# Patient Record
Sex: Female | Born: 1980 | Race: Asian | Hispanic: No | Marital: Married | Smoking: Former smoker
Health system: Southern US, Community
[De-identification: ages and names within clinical notes are randomized; demographics above are authoritative.]

## PROBLEM LIST (undated history)

## (undated) ENCOUNTER — Inpatient Hospital Stay (HOSPITAL_COMMUNITY): Payer: Self-pay

## (undated) DIAGNOSIS — D1803 Hemangioma of intra-abdominal structures: Secondary | ICD-10-CM

## (undated) DIAGNOSIS — Z8619 Personal history of other infectious and parasitic diseases: Secondary | ICD-10-CM

## (undated) DIAGNOSIS — O42919 Preterm premature rupture of membranes, unspecified as to length of time between rupture and onset of labor, unspecified trimester: Secondary | ICD-10-CM

## (undated) DIAGNOSIS — R7303 Prediabetes: Secondary | ICD-10-CM

## (undated) DIAGNOSIS — O24419 Gestational diabetes mellitus in pregnancy, unspecified control: Secondary | ICD-10-CM

## (undated) HISTORY — DX: Hemangioma of intra-abdominal structures: D18.03

## (undated) HISTORY — PX: WISDOM TOOTH EXTRACTION: SHX21

## (undated) HISTORY — DX: Personal history of other infectious and parasitic diseases: Z86.19

## (undated) HISTORY — DX: Gestational diabetes mellitus in pregnancy, unspecified control: O24.419

---
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
## 2001-02-12 ENCOUNTER — Other Ambulatory Visit: Admission: RE | Admit: 2001-02-12 | Discharge: 2001-02-12 | Payer: Self-pay | Admitting: Obstetrics and Gynecology

## 2002-02-16 ENCOUNTER — Other Ambulatory Visit: Admission: RE | Admit: 2002-02-16 | Discharge: 2002-02-16 | Payer: Self-pay | Admitting: Obstetrics and Gynecology

## 2003-03-24 ENCOUNTER — Other Ambulatory Visit: Admission: RE | Admit: 2003-03-24 | Discharge: 2003-03-24 | Payer: Self-pay | Admitting: Obstetrics and Gynecology

## 2004-03-29 ENCOUNTER — Other Ambulatory Visit: Admission: RE | Admit: 2004-03-29 | Discharge: 2004-03-29 | Payer: Self-pay | Admitting: Obstetrics and Gynecology

## 2007-11-19 ENCOUNTER — Other Ambulatory Visit: Admission: RE | Admit: 2007-11-19 | Discharge: 2007-11-19 | Payer: Self-pay | Admitting: Obstetrics & Gynecology

## 2008-02-21 ENCOUNTER — Inpatient Hospital Stay (HOSPITAL_COMMUNITY): Admission: AD | Admit: 2008-02-21 | Discharge: 2008-02-22 | Payer: Self-pay | Admitting: Obstetrics and Gynecology

## 2008-02-22 ENCOUNTER — Encounter (INDEPENDENT_AMBULATORY_CARE_PROVIDER_SITE_OTHER): Payer: Self-pay | Admitting: *Deleted

## 2008-02-22 ENCOUNTER — Ambulatory Visit (HOSPITAL_COMMUNITY): Admission: RE | Admit: 2008-02-22 | Discharge: 2008-02-22 | Payer: Self-pay | Admitting: Obstetrics and Gynecology

## 2008-02-22 ENCOUNTER — Telehealth: Payer: Self-pay | Admitting: Internal Medicine

## 2008-02-28 ENCOUNTER — Ambulatory Visit: Payer: Self-pay | Admitting: Internal Medicine

## 2008-02-28 DIAGNOSIS — D376 Neoplasm of uncertain behavior of liver, gallbladder and bile ducts: Secondary | ICD-10-CM

## 2008-05-22 ENCOUNTER — Ambulatory Visit (HOSPITAL_COMMUNITY): Admission: RE | Admit: 2008-05-22 | Discharge: 2008-05-22 | Payer: Self-pay | Admitting: Internal Medicine

## 2008-05-30 ENCOUNTER — Encounter: Admission: RE | Admit: 2008-05-30 | Discharge: 2008-05-30 | Payer: Self-pay | Admitting: Obstetrics and Gynecology

## 2008-07-03 ENCOUNTER — Observation Stay (HOSPITAL_COMMUNITY): Admission: AD | Admit: 2008-07-03 | Discharge: 2008-07-04 | Payer: Self-pay | Admitting: Obstetrics and Gynecology

## 2008-08-06 ENCOUNTER — Inpatient Hospital Stay (HOSPITAL_COMMUNITY): Admission: AD | Admit: 2008-08-06 | Discharge: 2008-08-06 | Payer: Self-pay | Admitting: Obstetrics and Gynecology

## 2008-08-08 ENCOUNTER — Inpatient Hospital Stay (HOSPITAL_COMMUNITY): Admission: AD | Admit: 2008-08-08 | Discharge: 2008-08-10 | Payer: Self-pay | Admitting: Obstetrics & Gynecology

## 2008-08-29 ENCOUNTER — Encounter (INDEPENDENT_AMBULATORY_CARE_PROVIDER_SITE_OTHER): Payer: Self-pay

## 2008-08-30 ENCOUNTER — Telehealth: Payer: Self-pay | Admitting: Internal Medicine

## 2008-09-01 ENCOUNTER — Encounter: Admission: RE | Admit: 2008-09-01 | Discharge: 2008-09-01 | Payer: Self-pay | Admitting: Internal Medicine

## 2009-03-06 ENCOUNTER — Telehealth (INDEPENDENT_AMBULATORY_CARE_PROVIDER_SITE_OTHER): Payer: Self-pay

## 2009-03-20 ENCOUNTER — Ambulatory Visit (HOSPITAL_COMMUNITY): Admission: RE | Admit: 2009-03-20 | Discharge: 2009-03-20 | Payer: Self-pay | Admitting: Internal Medicine

## 2009-12-06 ENCOUNTER — Encounter (INDEPENDENT_AMBULATORY_CARE_PROVIDER_SITE_OTHER): Payer: Self-pay | Admitting: *Deleted

## 2009-12-07 IMAGING — US US ABDOMEN COMPLETE
1 series · 13 of 25 positions shown · non-contrast
Comparison: None

CLINICAL DATA: Right upper quadrant pain with nausea and vomiting.
15 weeks estimated gestational age

ABDOMEN ULTRASOUND
TECHNIQUE: Complete abdominal ultrasound examination was performed
including evaluation of the liver, gallbladder, bile ducts,
pancreas, kidneys, spleen, IVC, and abdominal aorta.

[Series 1: us abdomen complete · 13 of 84 slices shown]
[im 1/84]
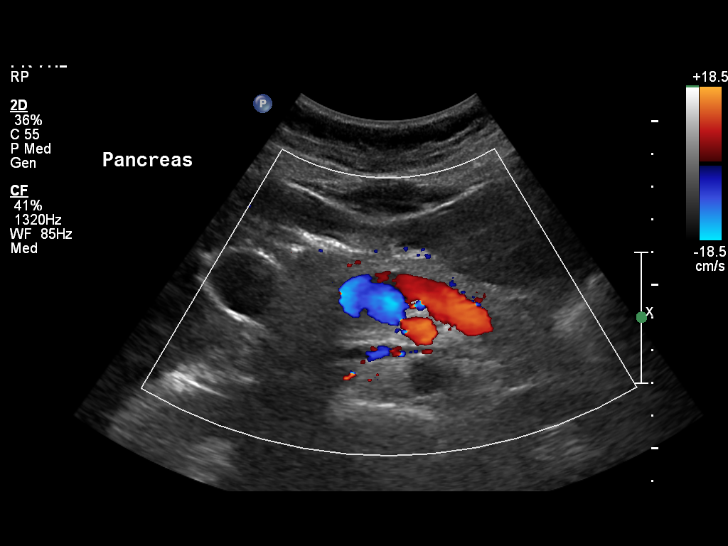
[im 7/84]
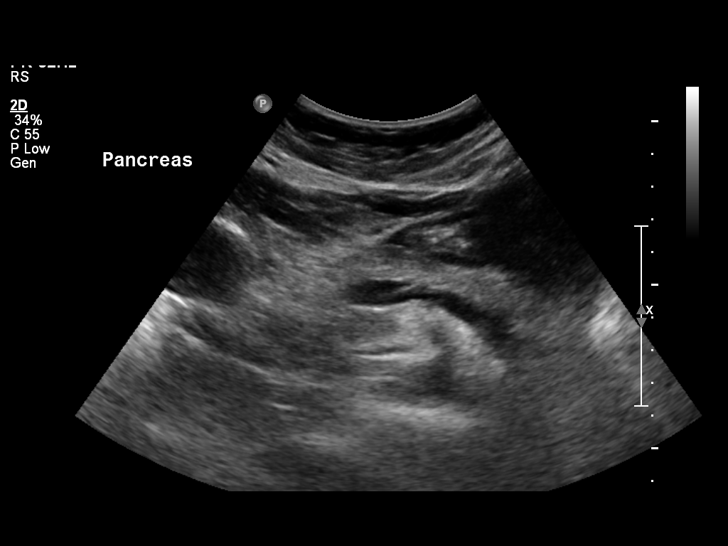
[im 14/84]
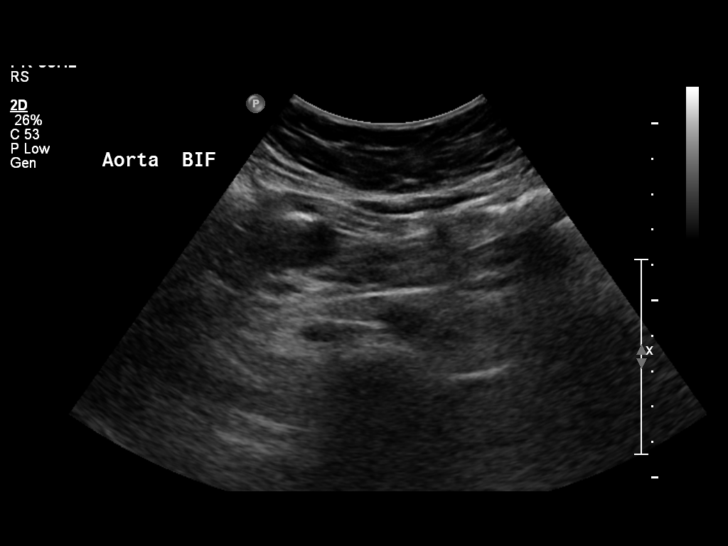
[im 21/84]
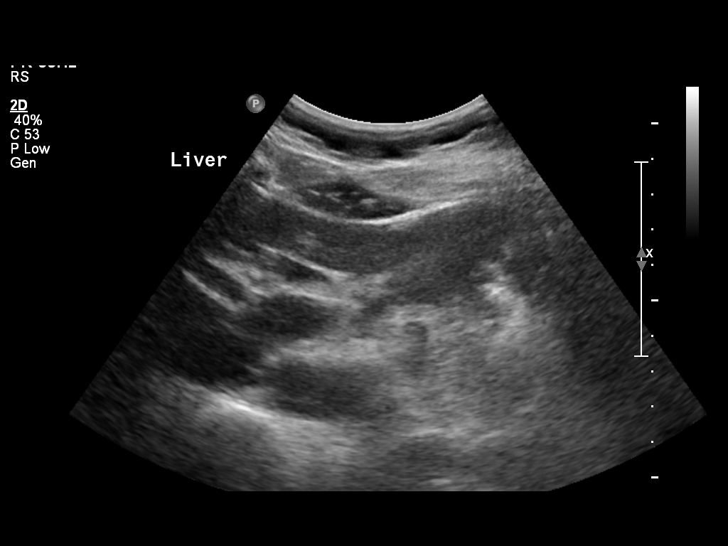
[im 28/84]
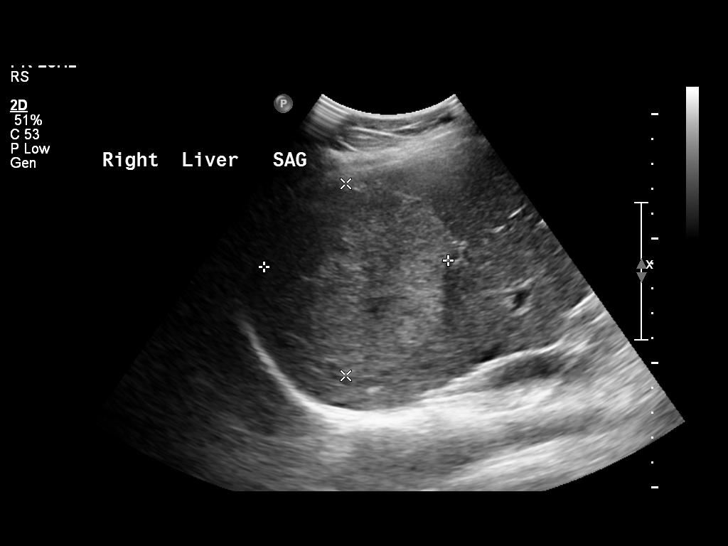
[im 35/84]
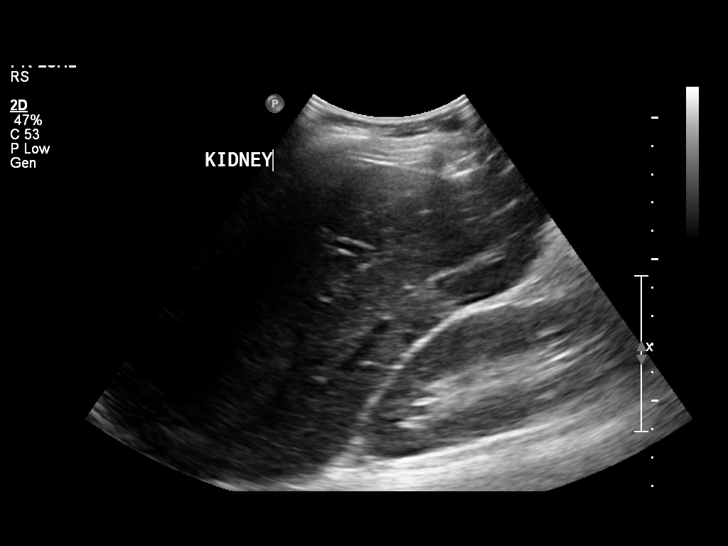
[im 42/84]
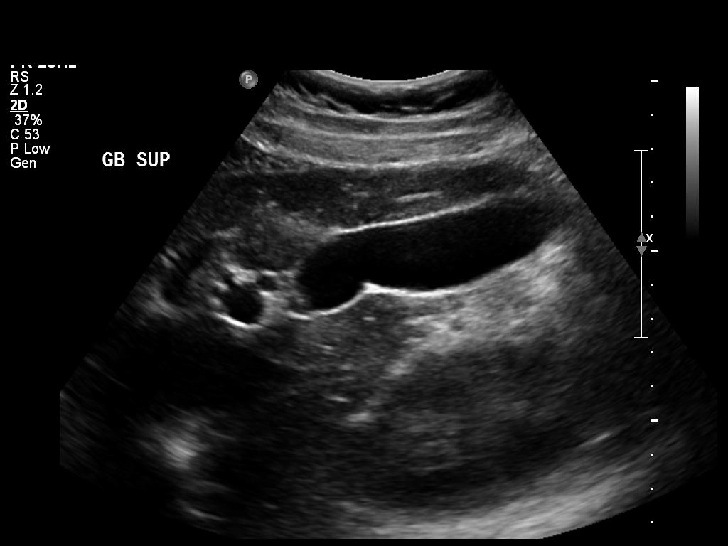
[im 49/84]
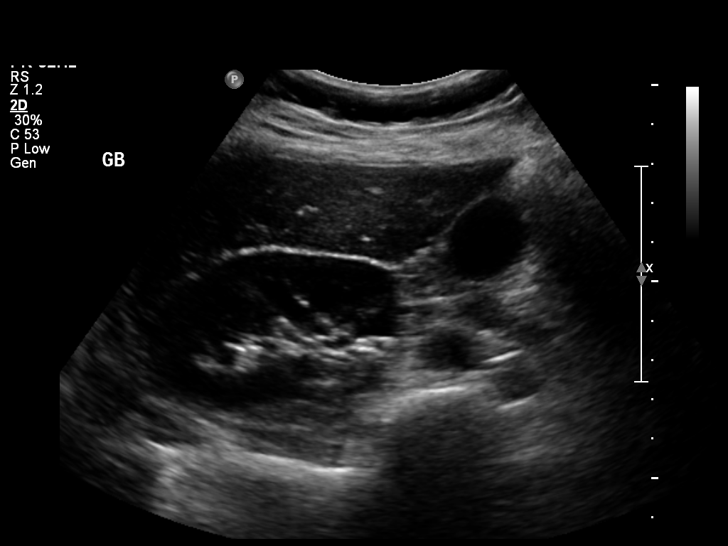
[im 56/84]
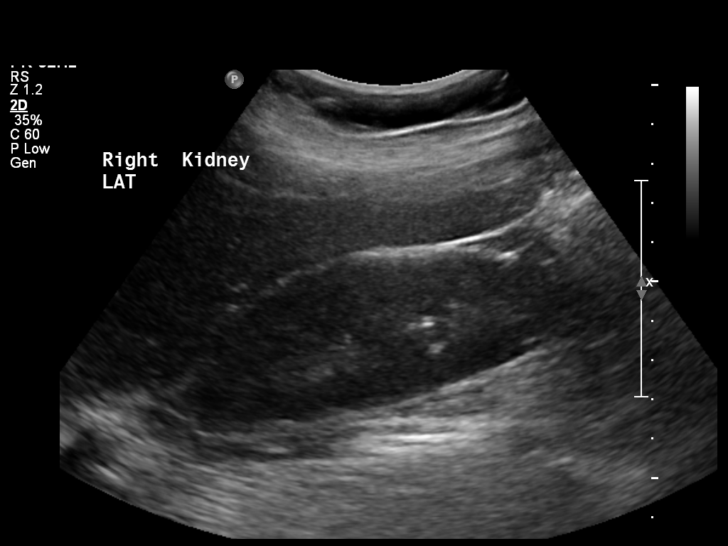
[im 63/84]
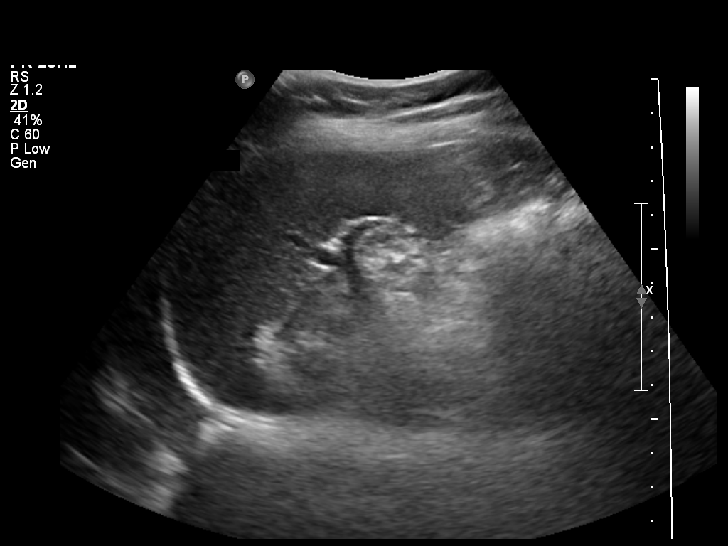
[im 70/84]
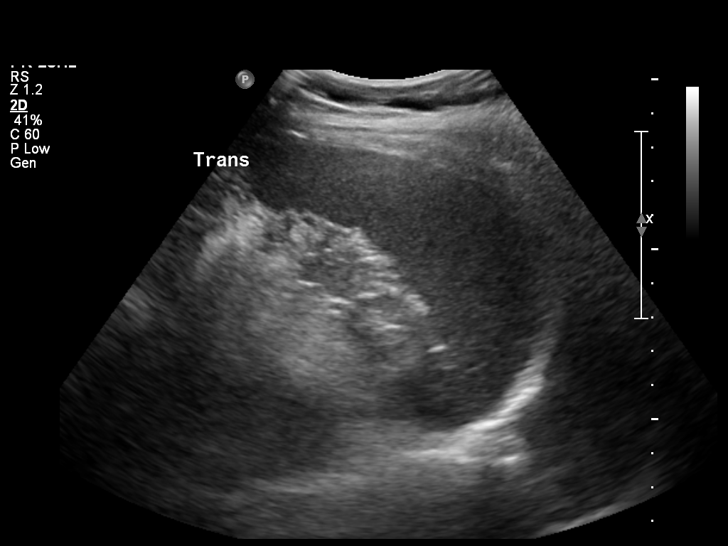
[im 77/84]
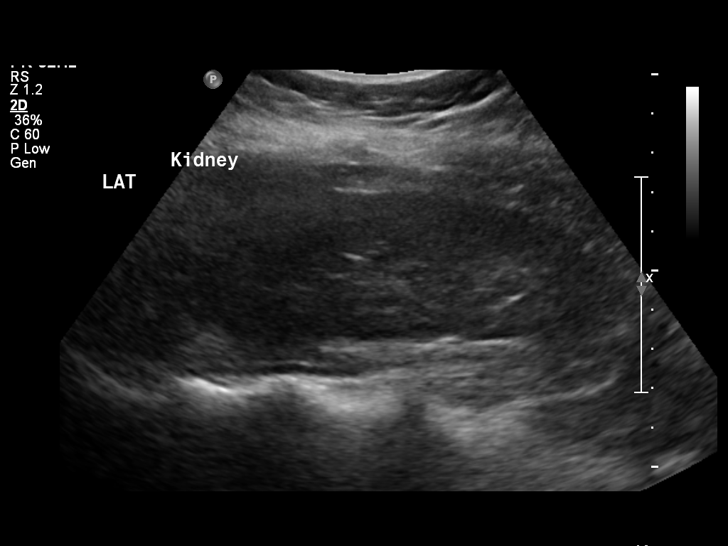
[im 84/84]
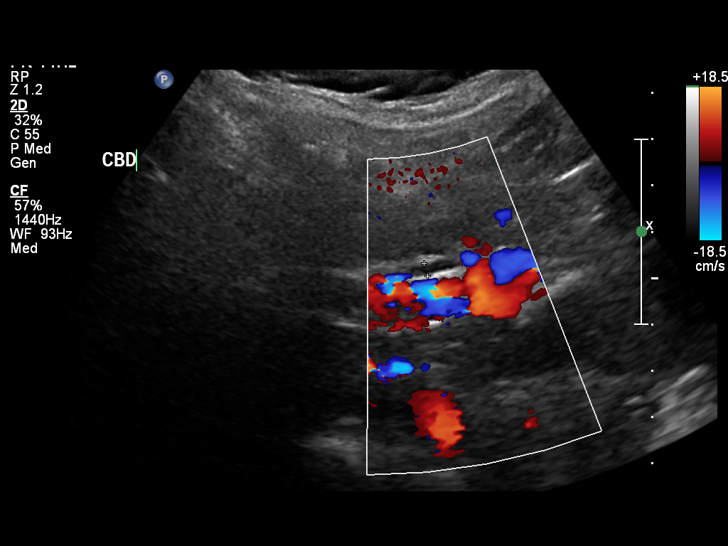

[13 of 25 positions shown; findings below may reference images not displayed]

FINDINGS: There is a rounded circumscribed predominantly echogenic
mass identified in the right lobe of the liver measuring 7.4 x
x 7.1 cm.  On color Doppler evaluation, this demonstrates a paucity
of internal flow.  Displacement of circumferential vessels is
noted.  No central scar is seen and no large feeding vessels are
suggested on color Doppler exam.  Given the ultrasound appearance,
this most likely represents a cavernous hemangioma.  Focal fatty
infiltration is felt unlikely given the displacement of
circumferential vessels.  Lack of prominent internal vascularity
would mitigate against focal nodular hyperplasia, hepatic adenoma
or hepatic cellular carcinoma.  The remainder of the hepatic
parenchyma is homogeneous with no signs of intrahepatic ductal
dilatation.

The gallbladder is well distended and shows no evidence for
intraluminal stones or sludge.  No pericholecystic fluid or
gallbladder wall thickening is noted and the common bile duct has a
normal AP width of 2.9 mm.

The pancreas is seen in its entirety and is normal in size and
echotexture.  The spleen is normal in size and echotexture.

Both kidneys have an unremarkable appearance with the right kidney
having a sagittal length of 11.5 cm and the left kidney having a
sagittal length of 11.3 cm.  No focal parenchymal abnormalities or
signs of intrahepatic ductal dilatation are seen.

The abdominal aorta is normal in appearance with a maximal caliber
of 1.7 cm.  The proximal portion of the inferior vena cava is
unremarkable.  No intra-abdominal fluid is seen.
IMPRESSION: Circumscribed echogenic mass in the right lobe of the liver as
described above.  Given the sonographic appearance, this most
likely represents a cavernous hemangioma.  This can be further
assessed and hopefully confirmed with MRI.  Alternatively short-
term reevaluation to assess for stability can be undertaken with
ultrasound.  No other focal abdominal abnormality noted.

These study results were discussed with Dr. Ceejay nurse.

## 2010-04-02 NOTE — Letter (Signed)
Summary: Office Visit Letter   Gastroenterology  [address] [place], [place] [address]   Phone: [phone]  Fax: [phone]      December 06, 2009 MRN: [id number]   Paige Chambers [address] [place], [place]  [address]   Dear [name],   According to our records, it is time for you to schedule a follow-up office visit with [place].   At your convenience, please call [phone] (option #2)to schedule an office visit. If you have any questions, concerns, or feel that this letter is in error, we would appreciate your call.   Sincerely,   Iva Boop, M.D.  [facility] Gastroenterology [facility] [phone]

## 2010-04-02 NOTE — Progress Notes (Signed)
Summary: Schedule MRI  Phone Note Outgoing Call Call back at [facility] Phone [phone]   Call placed by: Darcey Nora RN, CGRN,  March 06, 2009 11:17 AM Call placed to: Patient Summary of Call: I contacted the patient about scheduling a follow up MRI liver with and without contrast.  Per the MRI from 08/2008 patient will need a different type of contrast called Eovist.  Patient  as well as scheduling have been told of the need for a different type of contrast.  Patient  also has changed insurance to [facility], she will send me a copy of her ins cards.  She is scheduled at [facility] for 03-13-09 9:00 at [facility].  Patient  verbalized understanding of all instructions Initial call taken by: Darcey Nora RN, CGRN,  March 06, 2009 11:22 AM  Follow-up for Phone Call        Patient  faxed a copy of her ins , Morrie Sheldon the precert coordinator will update her ins info. Follow-up by: Darcey Nora RN, CGRN,  March 06, 2009 3:15 PM

## 2010-04-08 ENCOUNTER — Telehealth: Payer: Self-pay | Admitting: Internal Medicine

## 2010-04-18 NOTE — Progress Notes (Signed)
Summary: Hospital Procedures  Phone Note Call from Patient Call back at Home Phone [phone]   Caller: Patient Call For: Dr. Leone Payor Reason for Call: Talk to Nurse Summary of Call: Pt needs to schedule her yearly MRI? Initial call taken by: [place] Johnson,  April 08, 2010 9:10 AM  Follow-up for Phone Call        Patient was due for office visit in October to discuss scehduling a follow up MRI.  She is scheduled for 04/23/10 2:15 to discuss further. Follow-up by: Darcey Nora RN, CGRN,  April 08, 2010 9:47 AM

## 2010-04-23 ENCOUNTER — Ambulatory Visit (INDEPENDENT_AMBULATORY_CARE_PROVIDER_SITE_OTHER): Payer: PRIVATE HEALTH INSURANCE | Admitting: Internal Medicine

## 2010-04-23 ENCOUNTER — Encounter: Payer: Self-pay | Admitting: Internal Medicine

## 2010-04-23 ENCOUNTER — Other Ambulatory Visit: Payer: Self-pay | Admitting: Internal Medicine

## 2010-04-23 DIAGNOSIS — D1739 Benign lipomatous neoplasm of skin and subcutaneous tissue of other sites: Secondary | ICD-10-CM | POA: Insufficient documentation

## 2010-04-23 DIAGNOSIS — K7689 Other specified diseases of liver: Secondary | ICD-10-CM

## 2010-04-23 DIAGNOSIS — D376 Neoplasm of uncertain behavior of liver, gallbladder and bile ducts: Secondary | ICD-10-CM

## 2010-04-29 ENCOUNTER — Ambulatory Visit (HOSPITAL_COMMUNITY)
Admission: RE | Admit: 2010-04-29 | Discharge: 2010-04-29 | Disposition: A | Discharge status: Home / Self Care | Payer: PRIVATE HEALTH INSURANCE | Source: Ambulatory Visit | Attending: Internal Medicine | Admitting: Internal Medicine

## 2010-04-29 DIAGNOSIS — K7689 Other specified diseases of liver: Secondary | ICD-10-CM

## 2010-04-29 DIAGNOSIS — K769 Liver disease, unspecified: Secondary | ICD-10-CM | POA: Insufficient documentation

## 2010-04-30 NOTE — Assessment & Plan Note (Signed)
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 Here to discuss follow-up plans. Some vague right sided abdominal pain at times that is not very bothersome and has no pattern. Also a subcutaneous lesion on right side, ? liponma. Noticed because it was painful.  Current Medications (verified): 1)  Sprintec 28 0.25-35 Mg-Mcg Tabs (Norgestimate-Eth Estradiol) .Marland Kitchen.. 1 By Mouth Once Daily  Allergies (verified): 1)  ! * Multihance Mr Contrast  Past History:  Past Surgical History: Reviewed history from 02/28/2008 and no changes required. Unremarkable  GI Review of Systems    Reports abdominal pain.      Denies acid reflux, belching, bloating, chest pain, dysphagia with liquids, dysphagia with solids, heartburn, loss of appetite, nausea, vomiting, vomiting blood, weight loss, and  weight gain.        Denies anal fissure, black tarry stools, change in bowel habit, constipation, diarrhea, diverticulosis, fecal incontinence, heme positive stool, hemorrhoids, irritable bowel syndrome, jaundice, light color stool, liver problems, rectal bleeding, and  rectal pain.  Family History: Reviewed history from 02/28/2008 and no changes required. Family History of Heart Disease:Both Parents No FH of Colon Cancer:  Social History: Occupation: PA, [employer] Married, one child Patient is a former smoker.Qquit 4 months prior Alcohol Use - no Illicit Drug Use - no Patient does not get regular exercise.   Physical Exam  General:  Well developed, well nourished, no acute distress. Chest Wall:  1-2 cm fleshy and somewhat rubbery area over  right ribs in mid-axillary line - nontender   Impression & Recommendations:  Problem # 1:  LIVER MASS (ICD-235.3) 03/20/09 MRI 1.  No change in dominant mass in the right hepatic lobe with imaging findings typical of focal nodular hyperplasia. Hepatocyte uptake within this lesion suggests hepatocellular origin and adds further specificity to the diagnosis of focal nodular hyperplasia. 2.  No increase in size of the two smaller hepatic lesions described previously.  These lesions also demonstrated hepatocyte uptake which further supports the diagnosis of  focal nodular hyperplasia.  Will reassess now on OCP also. [place] to be used for starters as probably adequate imaging. If no growth then observe. I have offered liver center referral again if desired.  Orders: Ultrasound Abdomen (UAS)  Problem # 2:  LIPOMA, SKIN (ICD-214.1) Assessment: New small right lateral chest wall lipoma based upon exam. No treatment or further evaluation needed.  Patient Instructions: 1)  You have been scheduled for a Abdominal Ultrasound at [facility] on 04/29/10.  Instructions have been provided. 2)  The medication list was reviewed and reconciled.  All changed / newly prescribed medications were explained.  A complete medication list was provided to the patient / caregiver.   Orders Added: 1)  Ultrasound Abdomen [UAS]

## 2010-06-10 LAB — RPR: RPR Ser Ql: NONREACTIVE

## 2010-06-10 LAB — CBC
Hemoglobin: 11.1 g/dL — ABNORMAL LOW (ref 12.0–15.0)
Hemoglobin: 14.1 g/dL (ref 12.0–15.0)
MCHC: 34.4 g/dL (ref 30.0–36.0)
MCV: 84.4 fL (ref 78.0–100.0)
RBC: 4.78 MIL/uL (ref 3.87–5.11)
RDW: 13.6 % (ref 11.5–15.5)
WBC: 8.9 10*3/uL (ref 4.0–10.5)

## 2010-06-11 LAB — STREP B DNA PROBE

## 2010-06-11 LAB — GLUCOSE, CAPILLARY
Glucose-Capillary: 125 mg/dL — ABNORMAL HIGH (ref 70–99)
Glucose-Capillary: 141 mg/dL — ABNORMAL HIGH (ref 70–99)

## 2010-12-06 LAB — CBC
HCT: 36.3 % (ref 36.0–46.0)
Hemoglobin: 12.3 g/dL (ref 12.0–15.0)
MCV: 87.3 fL (ref 78.0–100.0)
Platelets: 227 10*3/uL (ref 150–400)
RBC: 4.16 MIL/uL (ref 3.87–5.11)
RDW: 12.7 % (ref 11.5–15.5)

## 2010-12-06 LAB — COMPREHENSIVE METABOLIC PANEL
Albumin: 3.2 g/dL — ABNORMAL LOW (ref 3.5–5.2)
CO2: 25 mEq/L (ref 19–32)
Calcium: 8.9 mg/dL (ref 8.4–10.5)
Glucose, Bld: 101 mg/dL — ABNORMAL HIGH (ref 70–99)
Total Protein: 6.7 g/dL (ref 6.0–8.3)

## 2011-03-14 ENCOUNTER — Telehealth: Payer: Self-pay | Admitting: Internal Medicine

## 2011-03-14 DIAGNOSIS — K7689 Other specified diseases of liver: Secondary | ICD-10-CM

## 2011-03-14 DIAGNOSIS — R1011 Right upper quadrant pain: Secondary | ICD-10-CM

## 2011-03-14 NOTE — Telephone Encounter (Signed)
C/O 1 month history of RUQ pain.  She has a history of FNH and has had MRI last in 03-2009 and [place] 04/2010 and was advised no further imaging needed.  ! Month history of intermittent RUQ pain, very sharp takes her breath for a few minutes.  Does not correlate with meals, no other symptoms no N&V, sweating, fever or "Murphy's sign" (self performed).  She is wondering if you think this has anything to do with FNH?  Does she need re-imaging or an appt?

## 2011-03-14 NOTE — Telephone Encounter (Signed)
Schedule an [place] of liver (or abdomen) to see if there is any change in the size of the liver lesions. If growing could be the problem.

## 2011-03-14 NOTE — Telephone Encounter (Signed)
Patient aware that this is scheduled for 04/01/11 9:00.  She is aware to be NPO after midnight

## 2011-04-01 ENCOUNTER — Ambulatory Visit (HOSPITAL_COMMUNITY)
Admission: RE | Admit: 2011-04-01 | Discharge: 2011-04-01 | Disposition: A | Discharge status: Home / Self Care | Payer: PRIVATE HEALTH INSURANCE | Source: Ambulatory Visit | Attending: Internal Medicine | Admitting: Internal Medicine

## 2011-04-01 DIAGNOSIS — R1011 Right upper quadrant pain: Secondary | ICD-10-CM | POA: Insufficient documentation

## 2011-04-01 DIAGNOSIS — K7689 Other specified diseases of liver: Secondary | ICD-10-CM | POA: Insufficient documentation

## 2011-04-01 NOTE — Progress Notes (Signed)
Quick Note:  Ultrasound shows no change in size of liver lesion consistent with focal nodular hyperplasia. However she doing. If she is okay now would just observe for further problems, if not we'll make an office visit. ______

## 2012-09-02 LAB — OB RESULTS CONSOLE ABO/RH: RH Type: POSITIVE

## 2012-09-02 LAB — OB RESULTS CONSOLE ANTIBODY SCREEN: ANTIBODY SCREEN: NEGATIVE

## 2012-09-02 LAB — OB RESULTS CONSOLE HIV ANTIBODY (ROUTINE TESTING): HIV: NONREACTIVE

## 2012-09-02 LAB — OB RESULTS CONSOLE GC/CHLAMYDIA
Chlamydia: NEGATIVE
Gonorrhea: NEGATIVE

## 2012-09-02 LAB — OB RESULTS CONSOLE RPR: RPR: NONREACTIVE

## 2012-09-02 LAB — OB RESULTS CONSOLE HEPATITIS B SURFACE ANTIGEN: HEP B S AG: NEGATIVE

## 2012-09-02 LAB — OB RESULTS CONSOLE RUBELLA ANTIBODY, IGM: Rubella: IMMUNE

## 2013-03-03 NOTE — L&D Delivery Note (Signed)
Delivery Note At 8:37 AM a viable female was delivered via Vaginal, Spontaneous Delivery Apgars 9 and 9 Placenta status: spontaneously with 3 vessel cord, .  Cord:  with the following complications:none .  Cord pH: not obtained  Anesthesia: None  Episiotomy: none Lacerations:  Suture Repair: not applicable Est. Blood Loss (mL): 300  Mom to postpartum.  Baby to Couplet care / Skin to Skin.  Raigen Jagielski L 04/02/2013, 8:44 AM

## 2013-03-15 LAB — OB RESULTS CONSOLE GBS: STREP GROUP B AG: NEGATIVE

## 2013-03-30 ENCOUNTER — Telehealth (HOSPITAL_COMMUNITY): Payer: Self-pay | Admitting: *Deleted

## 2013-03-30 ENCOUNTER — Encounter (HOSPITAL_COMMUNITY): Payer: Self-pay | Admitting: *Deleted

## 2013-03-30 NOTE — Telephone Encounter (Signed)
Preadmission screen  

## 2013-04-02 ENCOUNTER — Inpatient Hospital Stay (HOSPITAL_COMMUNITY)
Admission: AD | Admit: 2013-04-02 | Discharge: 2013-04-03 | DRG: 775 | Disposition: A | Discharge status: Home / Self Care | Payer: BC Managed Care – PPO | Source: Ambulatory Visit | Attending: Obstetrics and Gynecology | Admitting: Obstetrics and Gynecology

## 2013-04-02 ENCOUNTER — Encounter (HOSPITAL_COMMUNITY): Payer: Self-pay | Admitting: *Deleted

## 2013-04-02 DIAGNOSIS — O99814 Abnormal glucose complicating childbirth: Principal | ICD-10-CM | POA: Diagnosis present

## 2013-04-02 DIAGNOSIS — D1739 Benign lipomatous neoplasm of skin and subcutaneous tissue of other sites: Secondary | ICD-10-CM

## 2013-04-02 DIAGNOSIS — D376 Neoplasm of uncertain behavior of liver, gallbladder and bile ducts: Secondary | ICD-10-CM

## 2013-04-02 DIAGNOSIS — Z3490 Encounter for supervision of normal pregnancy, unspecified, unspecified trimester: Secondary | ICD-10-CM

## 2013-04-02 LAB — CBC
HCT: 38.2 % (ref 36.0–46.0)
Hemoglobin: 13.5 g/dL (ref 12.0–15.0)
MCH: 28 pg (ref 26.0–34.0)
MCHC: 35.3 g/dL (ref 30.0–36.0)
MCV: 79.3 fL (ref 78.0–100.0)
PLATELETS: 191 10*3/uL (ref 150–400)
RBC: 4.82 MIL/uL (ref 3.87–5.11)
RDW: 13.4 % (ref 11.5–15.5)
WBC: 9.4 10*3/uL (ref 4.0–10.5)

## 2013-04-02 LAB — TYPE AND SCREEN
ABO/RH(D): B POS
Antibody Screen: NEGATIVE

## 2013-04-02 LAB — RPR: RPR Ser Ql: NONREACTIVE

## 2013-04-02 LAB — ABO/RH: ABO/RH(D): B POS

## 2013-04-02 MED ORDER — MEDROXYPROGESTERONE ACETATE 150 MG/ML IM SUSP: 150.0000 mg | INTRAMUSCULAR | Status: DC | PRN

## 2013-04-02 MED ORDER — SENNOSIDES-DOCUSATE SODIUM 8.6-50 MG PO TABS
2.0000 | ORAL_TABLET | ORAL | Status: DC
  Administered 2013-04-02: 2 via ORAL
  Filled 2013-04-02: qty 2

## 2013-04-02 MED ORDER — ONDANSETRON HCL 4 MG/2ML IJ SOLN: 4.0000 mg | INTRAMUSCULAR | Status: DC | PRN

## 2013-04-02 MED ORDER — MEASLES, MUMPS & RUBELLA VAC ~~LOC~~ INJ
0.5000 mL | INJECTION | Freq: Once | SUBCUTANEOUS | Status: DC
  Filled 2013-04-02: qty 0.5

## 2013-04-02 MED ORDER — LANOLIN HYDROUS EX OINT: TOPICAL_OINTMENT | CUTANEOUS | Status: DC | PRN

## 2013-04-02 MED ORDER — IBUPROFEN 600 MG PO TABS
600.0000 mg | ORAL_TABLET | Freq: Four times a day (QID) | ORAL | Status: DC
  Administered 2013-04-02 – 2013-04-03 (×3): 600 mg via ORAL
  Filled 2013-04-02 (×3): qty 1

## 2013-04-02 MED ORDER — LIDOCAINE HCL (PF) 1 % IJ SOLN
30.0000 mL | INTRAMUSCULAR | Status: DC | PRN
  Filled 2013-04-02 (×2): qty 30

## 2013-04-02 MED ORDER — SIMETHICONE 80 MG PO CHEW: 80.0000 mg | CHEWABLE_TABLET | ORAL | Status: DC | PRN

## 2013-04-02 MED ORDER — ONDANSETRON HCL 4 MG PO TABS: 4.0000 mg | ORAL_TABLET | ORAL | Status: DC | PRN

## 2013-04-02 MED ORDER — BENZOCAINE-MENTHOL 20-0.5 % EX AERO: 1.0000 "application " | INHALATION_SPRAY | CUTANEOUS | Status: DC | PRN

## 2013-04-02 MED ORDER — TETANUS-DIPHTH-ACELL PERTUSSIS 5-2.5-18.5 LF-MCG/0.5 IM SUSP: 0.5000 mL | Freq: Once | INTRAMUSCULAR | Status: DC

## 2013-04-02 MED ORDER — OXYCODONE-ACETAMINOPHEN 5-325 MG PO TABS: 1.0000 | ORAL_TABLET | ORAL | Status: DC | PRN

## 2013-04-02 MED ORDER — OXYTOCIN 40 UNITS IN LACTATED RINGERS INFUSION - SIMPLE MED
62.5000 mL/h | INTRAVENOUS | Status: DC
  Administered 2013-04-02: 62.5 mL/h via INTRAVENOUS
  Filled 2013-04-02: qty 1000

## 2013-04-02 MED ORDER — CITRIC ACID-SODIUM CITRATE 334-500 MG/5ML PO SOLN: 30.0000 mL | ORAL | Status: DC | PRN

## 2013-04-02 MED ORDER — OXYCODONE-ACETAMINOPHEN 5-325 MG PO TABS
1.0000 | ORAL_TABLET | ORAL | Status: DC | PRN
Start: 2013-04-02 — End: 2013-04-02

## 2013-04-02 MED ORDER — FLEET ENEMA 7-19 GM/118ML RE ENEM: 1.0000 | ENEMA | Freq: Every day | RECTAL | Status: DC | PRN

## 2013-04-02 MED ORDER — PRENATAL MULTIVITAMIN CH: 1.0000 | ORAL_TABLET | Freq: Every day | ORAL | Status: DC

## 2013-04-02 MED ORDER — DIPHENHYDRAMINE HCL 25 MG PO CAPS: 25.0000 mg | ORAL_CAPSULE | Freq: Four times a day (QID) | ORAL | Status: DC | PRN

## 2013-04-02 MED ORDER — LACTATED RINGERS IV SOLN
INTRAVENOUS | Status: DC
  Administered 2013-04-02: 08:00:00 via INTRAVENOUS

## 2013-04-02 MED ORDER — BISACODYL 10 MG RE SUPP: 10.0000 mg | Freq: Every day | RECTAL | Status: DC | PRN

## 2013-04-02 MED ORDER — ONDANSETRON HCL 4 MG/2ML IJ SOLN: 4.0000 mg | Freq: Four times a day (QID) | INTRAMUSCULAR | Status: DC | PRN

## 2013-04-02 MED ORDER — OXYTOCIN BOLUS FROM INFUSION: 500.0000 mL | INTRAVENOUS | Status: DC

## 2013-04-02 MED ORDER — ZOLPIDEM TARTRATE 5 MG PO TABS: 5.0000 mg | ORAL_TABLET | Freq: Every evening | ORAL | Status: DC | PRN

## 2013-04-02 MED ORDER — WITCH HAZEL-GLYCERIN EX PADS: 1.0000 "application " | MEDICATED_PAD | CUTANEOUS | Status: DC | PRN

## 2013-04-02 MED ORDER — LACTATED RINGERS IV SOLN: 500.0000 mL | INTRAVENOUS | Status: DC | PRN

## 2013-04-02 MED ORDER — DIBUCAINE 1 % RE OINT: 1.0000 "application " | TOPICAL_OINTMENT | RECTAL | Status: DC | PRN

## 2013-04-02 MED ORDER — FLEET ENEMA 7-19 GM/118ML RE ENEM: 1.0000 | ENEMA | Freq: Once | RECTAL | Status: DC

## 2013-04-02 MED ORDER — ACETAMINOPHEN 325 MG PO TABS: 650.0000 mg | ORAL_TABLET | ORAL | Status: DC | PRN

## 2013-04-02 MED ORDER — IBUPROFEN 600 MG PO TABS
600.0000 mg | ORAL_TABLET | Freq: Four times a day (QID) | ORAL | Status: DC | PRN
  Administered 2013-04-02: 600 mg via ORAL
  Filled 2013-04-02: qty 1

## 2013-04-02 NOTE — H&P (Signed)
Paige Chambers is a 33 y.o. female presenting for labor. Maternal Medical History:  Reason for admission: Rupture of membranes.   Contractions: Onset was 1-2 hours ago.   Frequency: regular.   Perceived severity is strong.    Fetal activity: Perceived fetal activity is normal.    Prenatal complications: no prenatal complications   OB History   Grav Para Term Preterm Abortions TAB SAB Ect Mult Living   2 1 1       1      Past Medical History  Diagnosis Date  . Hx of varicella   . Anxiety   . Gestational diabetes   . Liver hemangioma    Past Surgical History  Procedure Laterality Date  . Wisdom tooth extraction     Family History: family history includes Heart disease in her father; Hypertension in her father and mother. Social History:  reports that she has never smoked. She has never used smokeless tobacco. She reports that she does not drink alcohol or use illicit drugs.   Prenatal Transfer Tool  Maternal Diabetes: GDM Genetic Screening: Normal Maternal Ultrasounds/Referrals: Normal Fetal Ultrasounds or other Referrals:  None Maternal Substance Abuse:  No Significant Maternal Medications:  None Significant Maternal Lab Results:  None Other Comments:  None  Review of Systems  All other systems reviewed and are negative.    Dilation: 10 Effacement (%): 100 Station: +1 Exam by:: Dr Helane Rima Last menstrual period 07/06/2012. Maternal Exam:  Uterine Assessment: Contraction strength is moderate.  Contraction frequency is regular.   Abdomen: Fetal presentation: vertex  Introitus: Normal vulva.   Fetal Exam Fetal State Assessment: Category I - tracings are normal.     Physical Exam  Nursing note and vitals reviewed. Constitutional: She appears well-developed.  HENT:  Head: Normocephalic.  Eyes: Pupils are equal, round, and reactive to light.  Neck: Normal range of motion.  Cardiovascular: Normal rate.   Respiratory: Effort normal.    Prenatal  labs: ABO, Rh: B/Positive/-- (07/03 0000) Antibody: Negative (07/03 0000) Rubella: Immune (07/03 0000) RPR: Nonreactive (07/03 0000)  HBsAg: Negative (07/03 0000)  HIV: Non-reactive (07/03 0000)  GBS: Negative (01/13 0000)   Assessment/Plan: SVD  Scotti Motter L 04/02/2013, 8:42 AM

## 2013-04-02 NOTE — Lactation Note (Signed)
This note was copied from the chart of [place]. Lactation Consultation Note  Patient Name: Boy Primrose Oler [id number] Date: 04/02/2013 Reason for consult: Initial assessment Baby is 6 hours old. Mom able to hand express colostrum. Assisted mom to attempt to latch baby in football position. Baby sleepy, wouldn't latch. Reviewed basics of breastfeeding. Enc STS, cue-based feeding, and breast massage and hand expression. Mom given [facility] BF brochure, aware of OP/BFSG services. Mom enc to call out for assistance as needed.   Maternal Data Formula Feeding for Exclusion: No Has patient been taught Hand Expression?: Yes Does the patient have breastfeeding experience prior to this delivery?: Yes  Feeding Feeding Type: Breast Fed Length of feed: 10 min  LATCH Score/Interventions Latch: Too sleepy or reluctant, no latch achieved, no sucking elicited. Intervention(s): Teach feeding cues;Waking techniques  Audible Swallowing: None Intervention(s): Skin to skin;Hand expression  Type of Nipple: Everted at rest and after stimulation  Comfort (Breast/Nipple): Soft / non-tender     Hold (Positioning): Assistance needed to correctly position infant at breast and maintain latch.  LATCH Score: 5  Lactation Tools Discussed/Used     Consult Status Consult Status: Follow-up Date: 04/03/13 Follow-up type: In-patient    [name] 04/02/2013, 5:39 PM

## 2013-04-03 LAB — CBC
HCT: 31.5 % — ABNORMAL LOW (ref 36.0–46.0)
Hemoglobin: 10.8 g/dL — ABNORMAL LOW (ref 12.0–15.0)
MCH: 27.8 pg (ref 26.0–34.0)
MCHC: 34.6 g/dL (ref 30.0–36.0)
MCV: 80.4 fL (ref 78.0–100.0)
Platelets: 159 10*3/uL (ref 150–400)
RBC: 3.92 MIL/uL (ref 3.87–5.11)
RDW: 13.6 % (ref 11.5–15.5)
WBC: 10.9 10*3/uL — ABNORMAL HIGH (ref 4.0–10.5)

## 2013-04-03 MED ORDER — IBUPROFEN 600 MG PO TABS: 600.0000 mg | ORAL_TABLET | Freq: Three times a day (TID) | ORAL | Status: DC | PRN

## 2013-04-03 NOTE — Discharge Summary (Signed)
Obstetric Discharge Summary Reason for Admission: onset of labor Prenatal Procedures: none Intrapartum Procedures: spontaneous vaginal delivery Postpartum Procedures: none Complications-Operative and Postpartum: none Hemoglobin  Date Value Range Status  04/03/2013 10.8* 12.0 - 15.0 g/dL Final     REPEATED TO VERIFY     DELTA CHECK NOTED     HCT  Date Value Range Status  04/03/2013 31.5* 36.0 - 46.0 % Final    Physical Exam:  General: alert Lochia: appropriate Uterine Fundus: firm Incision: healing well DVT Evaluation: No evidence of DVT seen on physical exam.  Discharge Diagnoses: Term Pregnancy-delivered  Discharge Information: Date: 04/03/2013 Activity: pelvic rest Diet: routine Medications: Ibuprofen Condition: improved Instructions: refer to practice specific booklet Discharge to: home   Newborn Data: Live born female  Birth Weight: 6 lb 13.9 oz (3116 g) APGAR: 9, 9  Home with mother.  Adriena Manfre L 04/03/2013, 7:17 AM

## 2013-04-03 NOTE — Lactation Note (Signed)
This note was copied from the chart of [name]. Lactation Consultation Note Follow up consult:  [name] 67 hours old.  Baby circumcised this morning and is sleepy.  Mother was able to hand express some drops of colostrum. Mother used hand pump last night to stimulate breasts. Assisted baby in football hold, a few sucks observed after much stimulation.  Baby breastfed intermittently for 10 min.  Encouraged longer feedings.  Reviewed waking techniques, cluster feeding, supply and demand, 8-12 feedings a day, Baby & Me Book pp 20-24, engorgement care.  Family will visit [facility] tomorrow.  Recommend visiting the lactation consultant also.  Encouraged parents to call for further assistance.   Patient Name: Paige Chambers [id number] Date: 04/03/2013 Reason for consult: Follow-up assessment   Maternal Data Formula Feeding for Exclusion: No  Feeding Feeding Type: Breast Fed Length of feed: 10 min  LATCH Score/Interventions Latch: Repeated attempts needed to sustain latch, nipple held in mouth throughout feeding, stimulation needed to elicit sucking reflex. Intervention(s): Waking techniques;Skin to skin Intervention(s): Assist with latch;Adjust position;Breast massage  Audible Swallowing: A few with stimulation Intervention(s): Hand expression Intervention(s): Alternate breast massage  Type of Nipple: Everted at rest and after stimulation  Comfort (Breast/Nipple): Soft / non-tender     Hold (Positioning): Assistance needed to correctly position infant at breast and maintain latch. Intervention(s): Breastfeeding basics reviewed;Support Pillows;Position options;Skin to skin  LATCH Score: 7  Lactation Tools Discussed/Used     Consult Status Consult Status: PRN    Carlye Grippe 04/03/2013, 11:54 AM

## 2013-04-03 NOTE — Progress Notes (Signed)
Post Partum Day 1 Subjective: no complaints wants to go home  Objective: Blood pressure 116/76, pulse 75, temperature 97.9 F (36.6 C), temperature source Oral, resp. rate 16, unknown if currently breastfeeding.  Physical Exam:  General: alert, cooperative and appears stated age 33: appropriate Uterine Fundus: firm Incision: healing well DVT Evaluation: No evidence of DVT seen on physical exam.   Recent Labs  04/02/13 0815 04/03/13 0633  HGB 13.5 10.8*  HCT 38.2 31.5*    Assessment/Plan: Discharge home, Breastfeeding and Circumcision prior to discharge   LOS: 1 day   Jaishon Krisher L 04/03/2013, 7:16 AM

## 2013-04-03 NOTE — Progress Notes (Signed)
Patient was referred for history of anxiety. Case discussed with mother's nurse.   * Referral screened out by Clinical Social Worker because none of the following criteria appear to apply:  ~ History of anxiety/depression during this pregnancy, or of post-partum depression.  ~ Diagnosis of anxiety and/or depression within last 3 years  ~ History of depression due to pregnancy loss/loss of child  OR  * Patient's symptoms currently being treated with medication and/or therapy.  Please contact the Clinical Social Worker if needs arise, or by the patient's request.

## 2013-04-06 ENCOUNTER — Inpatient Hospital Stay (HOSPITAL_COMMUNITY): Admission: RE | Admit: 2013-04-06 | Payer: BC Managed Care – PPO | Source: Ambulatory Visit

## 2013-04-08 ENCOUNTER — Inpatient Hospital Stay (HOSPITAL_COMMUNITY): Admission: RE | Admit: 2013-04-08 | Payer: BC Managed Care – PPO | Source: Ambulatory Visit

## 2014-01-02 ENCOUNTER — Encounter (HOSPITAL_COMMUNITY): Payer: Self-pay | Admitting: *Deleted

## 2015-01-26 ENCOUNTER — Inpatient Hospital Stay (HOSPITAL_COMMUNITY)
Admission: AD | Admit: 2015-01-26 | Discharge: 2015-01-27 | Disposition: A | Discharge status: Home / Self Care | Payer: PRIVATE HEALTH INSURANCE | Source: Ambulatory Visit | Attending: Obstetrics and Gynecology | Admitting: Obstetrics and Gynecology

## 2015-01-26 ENCOUNTER — Encounter (HOSPITAL_COMMUNITY): Payer: Self-pay | Admitting: *Deleted

## 2015-01-26 DIAGNOSIS — O4692 Antepartum hemorrhage, unspecified, second trimester: Secondary | ICD-10-CM

## 2015-01-26 DIAGNOSIS — Z3A19 19 weeks gestation of pregnancy: Secondary | ICD-10-CM | POA: Insufficient documentation

## 2015-01-26 DIAGNOSIS — O4402 Placenta previa specified as without hemorrhage, second trimester: Secondary | ICD-10-CM

## 2015-01-26 DIAGNOSIS — O4412 Placenta previa with hemorrhage, second trimester: Secondary | ICD-10-CM | POA: Insufficient documentation

## 2015-01-26 NOTE — MAU Provider Note (Signed)
History     CSN: [id number]  Arrival date and time: 01/26/15 2306   First Provider Initiated Contact with Patient 01/26/15 2344      Chief Complaint  Patient presents with  . 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 The patient states that she was told that she has a placenta previa in the office recently. This is the third episode of vaginal bleeding. The most recent prior to today was on Wednesday. She states that she has an initial gush of blood that may fill up 1 pad and then bleeding slows significantly to brown spotting. This happened tonight around 2100. Upon arrival in MAU she is having minimal bleeding. She states mild lower abdominal cramping associated with the bleeding. She denies LOF. She has not taken anything for pain. She has not yet begun to feel consistent fetal movement.   OB History    Gravida Para Term Preterm AB TAB SAB Ectopic Multiple Living   4 2 2  1  1   2       Past Medical History  Diagnosis Date  . Hx of varicella   . Anxiety   . Gestational diabetes   . Liver hemangioma     Past Surgical History  Procedure Laterality Date  . Wisdom tooth extraction      Family History  Problem Relation Age of Onset  . Hypertension Mother   . Hypertension Father   . Heart disease Father     Social History  Substance Use Topics  . Smoking status: Never Smoker   . Smokeless tobacco: Never Used  . Alcohol Use: No    Allergies:  Allergies  Allergen Reactions  . Iohexol      Code: HIVES, Desc: hives and vomitting after given 20 ml multihance/ given 25mg  x 2 of benadryl//JV, Onset Date: [id number]     Prescriptions prior to admission  Medication Sig Dispense Refill Last Dose  . Prenatal Vit-Fe Fumarate-FA (PRENATAL MULTIVITAMIN) TABS tablet Take 1 tablet by mouth daily at 12 noon.   01/26/2015 at Unknown time  . ibuprofen (ADVIL,MOTRIN) 600 MG tablet Take 1  tablet (600 mg total) by mouth every 8 (eight) hours as needed. 30 tablet 0   . triamcinolone cream (KENALOG) 0.1 % Apply 1 application topically as needed.   Past Week at Unknown time    Review of Systems  Constitutional: Negative for fever and malaise/fatigue.  Gastrointestinal: Negative for abdominal pain.  Genitourinary:       + vaginal bleeding   Physical Exam   Blood pressure 122/56, pulse 90, temperature 98.3 F (36.8 C), resp. rate 18, height 5\' 4"  (1.626 m), weight 144 lb 3.2 oz (65.409 kg), unknown if currently breastfeeding.  Physical Exam  Nursing note and vitals reviewed. Constitutional: She is oriented to person, place, and time. She appears well-developed and well-nourished. No distress.  HENT:  Head: Normocephalic and atraumatic.  Cardiovascular: Normal rate.   Respiratory: Effort normal.  GI: Soft. She exhibits no distension and no mass. There is no tenderness. There is no rebound and no guarding.  Genitourinary:  No bleeding on pad noted  Neurological: She is alert and oriented to person, place, and time.  Skin: Skin is warm and dry. No erythema.  Psychiatric: She has a normal mood and affect.    MAU Course  Procedures None  MDM FHR - 150 bpm with doppler VSS. No  bleeding noted on the perineum.  Discussed with adkins. Patient is stable and having minimal bleeding now. Agrees that pelvic exam is not needed today. [facility] for discharge with reassurance, pelvic rest and bleeding precautions  Assessment and Plan  A: SIUP at [redacted]w[redacted]d Placenta previa Vaginal bleeding in pregnancy, second trimester  P: Discharge home Bleeding precautions and pelvic rest discussed Patient advised to follow-up with [facility] as scheduled for routine prenatal care or sooner PRN Patient may return to MAU as needed or if her condition were to change or worsen  Luvenia Redden, PA-C  01/26/2015, 11:44 PM

## 2015-01-26 NOTE — MAU Note (Signed)
Partial placenta previa. Had some bleeding last Friday, last Weds, and since 2100 tonight. Usually has bright red pad of blood initially and then goes to brownish and slows down and goes away. Mild cramping

## 2015-01-26 NOTE — Discharge Instructions (Signed)
Pelvic Rest Pelvic rest is sometimes recommended for women when:  1. The placenta is partially or completely covering the opening of the cervix (placenta previa). 2. There is bleeding between the uterine wall and the amniotic sac in the first trimester (subchorionic hemorrhage). 3. The cervix begins to open without labor starting (incompetent cervix, cervical insufficiency). 4. The labor is too early (preterm labor). HOME CARE INSTRUCTIONS  Do not have sexual intercourse, stimulation, or an orgasm.  Do not use tampons, douche, or put anything in the vagina.  Do not lift anything over 10 pounds (4.5 kg).  Avoid strenuous activity or straining your pelvic muscles. SEEK MEDICAL CARE IF:  You have any vaginal bleeding during pregnancy. Treat this as a potential emergency.  You have cramping pain felt low in the stomach (stronger than menstrual cramps).  You notice vaginal discharge (watery, mucus, or bloody).  You have a low, dull backache.  There are regular contractions or uterine tightening. SEEK IMMEDIATE MEDICAL CARE IF: You have vaginal bleeding and have placenta previa.    This information is not intended to replace advice given to you by your health care provider. Make sure you discuss any questions you have with your health care provider.   Document Released: 06/14/2010 Document Revised: 05/12/2011 Document Reviewed: 08/21/2014 Elsevier Interactive Patient Education 2016 [employer].  Placenta Previa Placenta previa is a condition in pregnant women where the placenta implants in the lower part of the uterus. The placenta either partially or completely covers the opening to the cervix. This is a problem because the baby must pass through the cervix during delivery. There are three types of placenta previa. They include:  5. Marginal placenta previa. The placenta is near the cervix, but does not cover the opening. 6. Partial placenta previa. The placenta covers part of the  cervical opening. 7. Complete placenta previa. The placenta covers the entire cervical opening.  Depending on the type of placenta previa, there is a chance the placenta may move into a normal position and no longer cover the cervix as the pregnancy progresses. It is important to keep all prenatal visits with your caregiver.  RISK FACTORS You may be more likely to develop placenta previa if you:   Are carrying more than one baby (multiples).   Have an abnormally shaped uterus.   Have scars on the lining of the uterus.   Had previous surgeries involving the uterus, such as a cesarean delivery.   Have delivered a baby previously.   Have a history of placenta previa.   Have smoked or used cocaine during pregnancy.   Are age 80 or older during pregnancy.  SYMPTOMS The main symptom of placenta previa is sudden, painless vaginal bleeding during the second half of pregnancy. The amount of bleeding can be light to very heavy. The bleeding may stop on its own, but almost always returns. Cramping, regular contractions, abdominal pain, and lower back pain can also occur with placenta previa.  DIAGNOSIS Placenta previa can be diagnosed through an ultrasound by finding where the placenta is located. The ultrasound may find placenta previa either during a routine prenatal visit or after vaginal bleeding is noticed. If you are diagnosed with placenta previa, your caregiver may avoid vaginal exams to reduce the risk of heavy bleeding. There is a chance that placenta previa may not be diagnosed until bleeding occurs during labor.  TREATMENT Specific treatment depends on:   How much you are bleeding or if the bleeding has stopped.  How  far along you are in your pregnancy.   The condition of the baby.   The location of the baby and placenta.   The type of placenta previa.  Depending on the factors above, your caregiver may recommend:   Decreased activity.   Bed rest at home or in  the hospital.  Pelvic rest. This means no sex, using tampons, douching, pelvic exams, or placing anything into the vagina.  A blood transfusion to replace maternal blood loss.  A cesarean delivery if the bleeding is heavy and cannot be controlled or the placenta completely covers the cervix.  Medication to stop premature labor or mature the fetal lungs if delivery is needed before the pregnancy is full term.  WHEN SHOULD YOU SEEK IMMEDIATE MEDICAL CARE IF YOU ARE SENT HOME WITH PLACENTA PREVIA? Seek immediate medical care if you show any symptoms of placenta previa. You will need to go to the hospital to get checked immediately. Again, those symptoms are:  Sudden, painless vaginal bleeding, even a small amount.  Cramping or regular contractions.  Lower back or abdominal pain.   This information is not intended to replace advice given to you by your health care provider. Make sure you discuss any questions you have with your health care provider.   Document Released: 02/17/2005 Document Revised: 03/10/2014 Document Reviewed: 05/21/2012 Elsevier Interactive Patient Education [employer].

## 2015-01-27 DIAGNOSIS — Z3A19 19 weeks gestation of pregnancy: Secondary | ICD-10-CM | POA: Diagnosis not present

## 2015-01-27 DIAGNOSIS — O4412 Placenta previa with hemorrhage, second trimester: Secondary | ICD-10-CM | POA: Diagnosis not present

## 2015-01-27 DIAGNOSIS — O4692 Antepartum hemorrhage, unspecified, second trimester: Secondary | ICD-10-CM | POA: Diagnosis not present

## 2015-01-27 DIAGNOSIS — O209 Hemorrhage in early pregnancy, unspecified: Secondary | ICD-10-CM | POA: Diagnosis present

## 2015-01-27 NOTE — Progress Notes (Signed)
Kerry Hough PA in to discuss d/c plan with pt. Written and verbal d/c instructions given and understanding voiced

## 2015-02-15 ENCOUNTER — Other Ambulatory Visit (HOSPITAL_COMMUNITY): Payer: Self-pay | Admitting: Obstetrics and Gynecology

## 2015-02-15 DIAGNOSIS — Z3689 Encounter for other specified antenatal screening: Secondary | ICD-10-CM

## 2015-02-15 DIAGNOSIS — O288 Other abnormal findings on antenatal screening of mother: Secondary | ICD-10-CM

## 2015-02-15 DIAGNOSIS — N856 Intrauterine synechiae: Secondary | ICD-10-CM

## 2015-02-15 DIAGNOSIS — O09522 Supervision of elderly multigravida, second trimester: Secondary | ICD-10-CM

## 2015-02-15 DIAGNOSIS — Z3A24 24 weeks gestation of pregnancy: Secondary | ICD-10-CM

## 2015-02-27 ENCOUNTER — Other Ambulatory Visit (HOSPITAL_COMMUNITY): Payer: Self-pay | Admitting: Obstetrics and Gynecology

## 2015-02-27 ENCOUNTER — Ambulatory Visit (HOSPITAL_COMMUNITY)
Admission: RE | Admit: 2015-02-27 | Discharge: 2015-02-27 | Disposition: A | Discharge status: Home / Self Care | Payer: PRIVATE HEALTH INSURANCE | Source: Ambulatory Visit | Attending: Obstetrics and Gynecology | Admitting: Obstetrics and Gynecology

## 2015-02-27 ENCOUNTER — Encounter (HOSPITAL_COMMUNITY): Payer: Self-pay

## 2015-02-27 DIAGNOSIS — O34592 Maternal care for other abnormalities of gravid uterus, second trimester: Secondary | ICD-10-CM | POA: Diagnosis not present

## 2015-02-27 DIAGNOSIS — O09292 Supervision of pregnancy with other poor reproductive or obstetric history, second trimester: Secondary | ICD-10-CM

## 2015-02-27 DIAGNOSIS — O4100X0 Oligohydramnios, unspecified trimester, not applicable or unspecified: Secondary | ICD-10-CM | POA: Insufficient documentation

## 2015-02-27 DIAGNOSIS — O288 Other abnormal findings on antenatal screening of mother: Secondary | ICD-10-CM

## 2015-02-27 DIAGNOSIS — O09522 Supervision of elderly multigravida, second trimester: Secondary | ICD-10-CM | POA: Insufficient documentation

## 2015-02-27 DIAGNOSIS — Z3689 Encounter for other specified antenatal screening: Secondary | ICD-10-CM

## 2015-02-27 DIAGNOSIS — Z3A24 24 weeks gestation of pregnancy: Secondary | ICD-10-CM

## 2015-02-27 DIAGNOSIS — N856 Intrauterine synechiae: Secondary | ICD-10-CM

## 2015-02-27 DIAGNOSIS — Z8632 Personal history of gestational diabetes: Secondary | ICD-10-CM

## 2015-03-02 ENCOUNTER — Encounter (HOSPITAL_COMMUNITY): Payer: Self-pay

## 2015-03-02 ENCOUNTER — Other Ambulatory Visit (HOSPITAL_COMMUNITY): Payer: Self-pay

## 2015-03-27 ENCOUNTER — Inpatient Hospital Stay (HOSPITAL_COMMUNITY): Payer: PRIVATE HEALTH INSURANCE

## 2015-03-27 ENCOUNTER — Encounter (HOSPITAL_COMMUNITY): Payer: Self-pay | Admitting: *Deleted

## 2015-03-27 ENCOUNTER — Inpatient Hospital Stay (HOSPITAL_COMMUNITY)
Admission: AD | Admit: 2015-03-27 | Discharge: 2015-04-16 | DRG: 766 | Disposition: A | Discharge status: Home / Self Care | Payer: PRIVATE HEALTH INSURANCE | Source: Ambulatory Visit | Attending: Obstetrics and Gynecology | Admitting: Obstetrics and Gynecology

## 2015-03-27 DIAGNOSIS — O09522 Supervision of elderly multigravida, second trimester: Secondary | ICD-10-CM

## 2015-03-27 DIAGNOSIS — O34593 Maternal care for other abnormalities of gravid uterus, third trimester: Secondary | ICD-10-CM

## 2015-03-27 DIAGNOSIS — Z3A30 30 weeks gestation of pregnancy: Secondary | ICD-10-CM | POA: Diagnosis not present

## 2015-03-27 DIAGNOSIS — O329XX0 Maternal care for malpresentation of fetus, unspecified, not applicable or unspecified: Secondary | ICD-10-CM | POA: Diagnosis present

## 2015-03-27 DIAGNOSIS — O24420 Gestational diabetes mellitus in childbirth, diet controlled: Secondary | ICD-10-CM | POA: Diagnosis present

## 2015-03-27 DIAGNOSIS — Z3A28 28 weeks gestation of pregnancy: Secondary | ICD-10-CM

## 2015-03-27 DIAGNOSIS — O09292 Supervision of pregnancy with other poor reproductive or obstetric history, second trimester: Secondary | ICD-10-CM

## 2015-03-27 DIAGNOSIS — O42913 Preterm premature rupture of membranes, unspecified as to length of time between rupture and onset of labor, third trimester: Principal | ICD-10-CM | POA: Diagnosis present

## 2015-03-27 DIAGNOSIS — O09291 Supervision of pregnancy with other poor reproductive or obstetric history, first trimester: Secondary | ICD-10-CM

## 2015-03-27 DIAGNOSIS — O4290 Premature rupture of membranes, unspecified as to length of time between rupture and onset of labor, unspecified weeks of gestation: Secondary | ICD-10-CM

## 2015-03-27 DIAGNOSIS — O09523 Supervision of elderly multigravida, third trimester: Secondary | ICD-10-CM

## 2015-03-27 DIAGNOSIS — O42919 Preterm premature rupture of membranes, unspecified as to length of time between rupture and onset of labor, unspecified trimester: Secondary | ICD-10-CM

## 2015-03-27 DIAGNOSIS — O4100X0 Oligohydramnios, unspecified trimester, not applicable or unspecified: Secondary | ICD-10-CM

## 2015-03-27 DIAGNOSIS — Z8632 Personal history of gestational diabetes: Secondary | ICD-10-CM

## 2015-03-27 DIAGNOSIS — O34592 Maternal care for other abnormalities of gravid uterus, second trimester: Secondary | ICD-10-CM

## 2015-03-27 DIAGNOSIS — Z98891 History of uterine scar from previous surgery: Secondary | ICD-10-CM

## 2015-03-27 HISTORY — DX: Preterm premature rupture of membranes, unspecified as to length of time between rupture and onset of labor, unspecified trimester: O42.919

## 2015-03-27 LAB — CBC
HEMATOCRIT: 36.3 % (ref 36.0–46.0)
HEMOGLOBIN: 12.5 g/dL (ref 12.0–15.0)
MCH: 28.7 pg (ref 26.0–34.0)
MCHC: 34.4 g/dL (ref 30.0–36.0)
MCV: 83.3 fL (ref 78.0–100.0)
Platelets: 232 10*3/uL (ref 150–400)
RBC: 4.36 MIL/uL (ref 3.87–5.11)
RDW: 13 % (ref 11.5–15.5)
WBC: 12.2 10*3/uL — ABNORMAL HIGH (ref 4.0–10.5)

## 2015-03-27 LAB — URINALYSIS, ROUTINE W REFLEX MICROSCOPIC
Bilirubin Urine: NEGATIVE
Glucose, UA: 100 mg/dL — AB
Ketones, ur: 15 mg/dL — AB
Nitrite: NEGATIVE
PROTEIN: 30 mg/dL — AB
Specific Gravity, Urine: <1.005 — ABNORMAL LOW (ref 1.005–1.030)
pH: 5.5 (ref 5.0–8.0)

## 2015-03-27 LAB — TYPE AND SCREEN
ABO/RH(D): B POS
ANTIBODY SCREEN: NEGATIVE

## 2015-03-27 LAB — URINE MICROSCOPIC-ADD ON

## 2015-03-27 LAB — AMNISURE RUPTURE OF MEMBRANE (ROM) NOT AT ARMC: Amnisure ROM: POSITIVE

## 2015-03-27 LAB — POCT FERN TEST: POCT Fern Test: POSITIVE

## 2015-03-27 LAB — GLUCOSE, CAPILLARY: GLUCOSE-CAPILLARY: 206 mg/dL — AB (ref 65–99)

## 2015-03-27 MED ORDER — SODIUM CHLORIDE 0.9 % IV SOLN: 250.0000 mL | INTRAVENOUS | Status: DC | PRN

## 2015-03-27 MED ORDER — BETAMETHASONE SOD PHOS & ACET 6 (3-3) MG/ML IJ SUSP
12.0000 mg | INTRAMUSCULAR | Status: AC
  Administered 2015-03-27 – 2015-03-28 (×2): 12 mg via INTRAMUSCULAR
  Filled 2015-03-27 (×2): qty 2

## 2015-03-27 MED ORDER — SODIUM CHLORIDE 0.9% FLUSH
3.0000 mL | Freq: Two times a day (BID) | INTRAVENOUS | Status: DC
  Administered 2015-03-28 – 2015-04-13 (×23): 3 mL via INTRAVENOUS

## 2015-03-27 MED ORDER — DOCUSATE SODIUM 100 MG PO CAPS
100.0000 mg | ORAL_CAPSULE | Freq: Every day | ORAL | Status: DC
  Administered 2015-03-28 – 2015-03-30 (×3): 100 mg via ORAL
  Filled 2015-03-27 (×6): qty 1

## 2015-03-27 MED ORDER — AMOXICILLIN 500 MG PO CAPS
500.0000 mg | ORAL_CAPSULE | Freq: Three times a day (TID) | ORAL | Status: AC
  Administered 2015-03-29 – 2015-04-03 (×15): 500 mg via ORAL
  Filled 2015-03-27 (×15): qty 1

## 2015-03-27 MED ORDER — SODIUM CHLORIDE 0.9 % IV SOLN
2.0000 g | Freq: Four times a day (QID) | INTRAVENOUS | Status: AC
  Administered 2015-03-27 – 2015-03-29 (×8): 2 g via INTRAVENOUS
  Filled 2015-03-27 (×8): qty 2000

## 2015-03-27 MED ORDER — CALCIUM CARBONATE ANTACID 500 MG PO CHEW: 2.0000 | CHEWABLE_TABLET | ORAL | Status: DC | PRN

## 2015-03-27 MED ORDER — SODIUM CHLORIDE 0.9% FLUSH
3.0000 mL | INTRAVENOUS | Status: DC | PRN
  Administered 2015-04-06: 3 mL via INTRAVENOUS
  Filled 2015-03-27: qty 3

## 2015-03-27 MED ORDER — ZOLPIDEM TARTRATE 5 MG PO TABS
5.0000 mg | ORAL_TABLET | Freq: Every evening | ORAL | Status: DC | PRN
  Administered 2015-03-29: 5 mg via ORAL
  Filled 2015-03-27: qty 1

## 2015-03-27 MED ORDER — PRENATAL MULTIVITAMIN CH
1.0000 | ORAL_TABLET | Freq: Every day | ORAL | Status: DC
  Administered 2015-03-28 – 2015-04-13 (×17): 1 via ORAL
  Filled 2015-03-27 (×17): qty 1

## 2015-03-27 MED ORDER — ACETAMINOPHEN 325 MG PO TABS
650.0000 mg | ORAL_TABLET | ORAL | Status: DC | PRN
  Administered 2015-03-28 – 2015-04-11 (×5): 650 mg via ORAL
  Filled 2015-03-27 (×5): qty 2

## 2015-03-27 MED ORDER — AZITHROMYCIN 250 MG PO TABS
500.0000 mg | ORAL_TABLET | Freq: Every day | ORAL | Status: AC
  Administered 2015-03-27 – 2015-04-02 (×7): 500 mg via ORAL
  Filled 2015-03-27 (×7): qty 2

## 2015-03-27 NOTE — MAU Provider Note (Signed)
Chief Complaint:  Rupture of Membranes   None     HPI: Paige Chambers is a 35 y.o. [phone] at 53w3dwho presents to maternity admissions reporting gush of clear fluid at home at 4 pm soaking her clothing.  She continues to report leakage of fluid after this initial gush. Nothing makes the leaking better or worse.  She denies abdominal cramping/contractions. She reports good fetal movement, denies vaginal bleeding, vaginal itching/burning, urinary symptoms, h/a, dizziness, n/v, or fever/chills.    HPI  Past Medical History: Past Medical History  Diagnosis Date  . Hx of varicella   . Gestational diabetes   . Liver hemangioma     Past obstetric history: OB History  Gravida Para Term Preterm AB SAB TAB Ectopic Multiple Living  4 2 2  1 1    2     # Outcome Date GA Lbr Len/2nd Weight Sex Delivery Anes PTL Lv  4 Current           3 Term 04/02/13 [redacted]w[redacted]d 01:22 / 00:15 6 lb 13.9 oz (3.116 kg) M Vag-Spont None  Y  2 Term 2010 [redacted]w[redacted]d 07:00 6 lb 8 oz (2.948 kg) M Vag-Spont EPI  Y  1 SAB              Comments: System Generated. Please review and update pregnancy details.      Past Surgical History: Past Surgical History  Procedure Laterality Date  . Wisdom tooth extraction      Family History: Family History  Problem Relation Age of Onset  . Hypertension Mother   . Hypertension Father   . Heart disease Father     Social History: Social History  Substance Use Topics  . Smoking status: Never Smoker   . Smokeless tobacco: Never Used  . Alcohol Use: No    Allergies:  Allergies  Allergen Reactions  . Iohexol Hives and Nausea And Vomiting         Meds:  Prescriptions prior to admission  Medication Sig Dispense Refill Last Dose  . famotidine (PEPCID) 20 MG tablet Take 20 mg by mouth daily as needed for heartburn or indigestion.   Past Week at Unknown time  . Prenatal Vit-Fe Fumarate-FA (PRENATAL MULTIVITAMIN) TABS tablet Take 1 tablet by mouth daily at 12 noon.   Past Week  at Unknown time    ROS:  Review of Systems  Constitutional: Negative for fever, chills and fatigue.  Respiratory: Negative for shortness of breath.   Cardiovascular: Negative for chest pain.  Gastrointestinal: Negative for nausea and vomiting.  Genitourinary: Positive for vaginal discharge. Negative for dysuria, flank pain, vaginal bleeding, difficulty urinating, vaginal pain and pelvic pain.  Neurological: Negative for dizziness and headaches.  Psychiatric/Behavioral: Negative.      I have reviewed patient's Past Medical Hx, Surgical Hx, Family Hx, Social Hx, medications and allergies.   Physical Exam   Patient Vitals for the past 24 hrs:  BP Temp Temp src Pulse Resp Height Weight  03/27/15 1714 131/66 mmHg 98 F (36.7 C) Oral 105 18 5\' 4"  (1.626 m) 150 lb (68.04 kg)   Constitutional: Well-developed, well-nourished female in no acute distress.  Cardiovascular: normal rate Respiratory: normal effort GI: Abd soft, non-tender, gravid appropriate for gestational age.  MS: Extremities nontender, no edema, normal ROM Neurologic: Alert and oriented x 4.  GU: Neg CVAT.  PELVIC EXAM: Cervix pink, visually closed, without lesion, positive pooling of clear fluid, vaginal walls and external genitalia normal  Ferning slide positive  FHT:  Baseline 135 , moderate variability, accelerations present, no decelerations Contractions: None on toco or to palpation   Labs: Results for orders placed or performed during the hospital encounter of 03/27/15 (from the past 24 hour(s))  Urinalysis, Routine w reflex microscopic (not at [facility])     Status: Abnormal   Collection Time: 03/27/15  5:00 PM  Result Value Ref Range   Color, Urine STRAW (A) YELLOW   APPearance CLEAR CLEAR   Specific Gravity, Urine <1.005 (L) 1.005 - 1.030   pH 5.5 5.0 - 8.0   Glucose, UA 100 (A) NEGATIVE mg/dL   Hgb urine dipstick MODERATE (A) NEGATIVE   Bilirubin Urine NEGATIVE NEGATIVE   Ketones, ur 15 (A) NEGATIVE  mg/dL   Protein, ur 30 (A) NEGATIVE mg/dL   Nitrite NEGATIVE NEGATIVE   Leukocytes, UA TRACE (A) NEGATIVE  Urine microscopic-add on     Status: Abnormal   Collection Time: 03/27/15  5:00 PM  Result Value Ref Range   Squamous Epithelial / LPF 6-30 (A) NONE SEEN   WBC, UA 6-30 0 - 5 WBC/hpf   RBC / HPF 0-5 0 - 5 RBC/hpf   Bacteria, UA RARE (A) NONE SEEN  Fern Test     Status: None   Collection Time: 03/27/15  6:00 PM  Result Value Ref Range   POCT Fern Test Positive = ruptured amniotic membanes   Amnisure rupture of membrane (rom)not at [facility]     Status: None   Collection Time: 03/27/15  6:05 PM  Result Value Ref Range   Amnisure ROM POSITIVE       MAU Course/MDM: I have ordered labs and reviewed results.  Consult Dr Julien Girt to review assessment and labs.  Discussed with pt PPROM with plan to admit, give steroid injections for lung development, IV abx, and plan for IOL at 34 weeks if no onset of labor.  Questions answered.  Pt stable at time of transfer.  Assessment: 1. [redacted] weeks gestation of pregnancy   2. Preterm premature rupture of membranes (PPROM) with unknown onset of labor   3. Advanced maternal age in multigravida, second trimester   4. Uterine abnormality in pregnancy in second trimester   5. History of gestational diabetes in prior pregnancy, currently pregnant, second trimester     Plan: Admit to antepartum BMZ x 2 in 24 hours Dr Julien Girt to see pt      Medication List    ASK your doctor about these medications        famotidine 20 MG tablet  Commonly known as:  PEPCID  Take 20 mg by mouth daily as needed for heartburn or indigestion.     prenatal multivitamin Tabs tablet  Take 1 tablet by mouth daily at 12 noon.        Fatima Blank Certified Nurse-Midwife 03/27/2015 7:46 PM

## 2015-03-27 NOTE — MAU Note (Signed)
4:00 pm patient states she had a gush of clear fluid, with continuing gushes. Occasional braxton hicks contractions, not worsening or painful. Good fetal movement noted. Denies any abdominal tenderness or fever.

## 2015-03-28 ENCOUNTER — Inpatient Hospital Stay (HOSPITAL_COMMUNITY): Payer: PRIVATE HEALTH INSURANCE

## 2015-03-28 LAB — CULTURE, BETA STREP (GROUP B ONLY)

## 2015-03-28 LAB — GLUCOSE, CAPILLARY
GLUCOSE-CAPILLARY: 111 mg/dL — AB (ref 65–99)
GLUCOSE-CAPILLARY: 161 mg/dL — AB (ref 65–99)
Glucose-Capillary: 138 mg/dL — ABNORMAL HIGH (ref 65–99)
Glucose-Capillary: 146 mg/dL — ABNORMAL HIGH (ref 65–99)

## 2015-03-28 MED ORDER — LACTATED RINGERS IV SOLN
INTRAVENOUS | Status: DC
  Administered 2015-03-28 – 2015-04-12 (×7): via INTRAVENOUS

## 2015-03-28 MED ORDER — MAGNESIUM SULFATE BOLUS VIA INFUSION
4.0000 g | Freq: Once | INTRAVENOUS | Status: AC
  Administered 2015-03-28: 4 g via INTRAVENOUS
  Filled 2015-03-28: qty 500

## 2015-03-28 MED ORDER — MAGNESIUM SULFATE 50 % IJ SOLN
2.0000 g/h | INTRAVENOUS | Status: DC
  Administered 2015-03-29 – 2015-03-30 (×2): 2 g/h via INTRAVENOUS
  Filled 2015-03-28 (×3): qty 80

## 2015-03-28 NOTE — Progress Notes (Signed)
Initial Nutrition Assessment  DOCUMENTATION CODES:   Not applicable  INTERVENTION:  CHO modified Gestational Diabetic diet  NUTRITION DIAGNOSIS:   Increased nutrient needs related to  (pregnancy and fetal growth requirments) as evidenced by  (28 weeks IUP).  GOAL:  Patient will meet greater than or equal to 90% of their needs  MONITOR:  Weight trends  REASON FOR ASSESSMENT:  Antenatal, Gestational Diabetes   ASSESSMENT:  28 4/7 PROM, GDM with prev preg and is well versed in CHO counting. 18 lb weight gain. pre-preg BMI 22.3, wt 130 lbs. Appetite good  Diet Order:  Diet gestational carb mod Room service appropriate?: Yes; Fluid consistency:: Thin  Skin:  Reviewed, no issues  Height:   Ht Readings from Last 1 Encounters:  03/27/15 5\' 4"  (1.626 m)    Weight:   Wt Readings from Last 1 Encounters:  03/28/15 148 lb 6.4 oz (67.314 kg)    Ideal Body Weight:  54.5 kg  BMI:  Body mass index is 25.46 kg/(m^2).  Estimated Nutritional Needs:   Kcal:  1700-1900  Protein:  76-86 g  Fluid:  2 L  EDUCATION NEEDS:   No education needs identified at this time  Cathlean Sauer.Fredderick Severance LDN Neonatal Nutrition Support Specialist/RD III Pager [phone]      Phone [phone]

## 2015-03-28 NOTE — Progress Notes (Signed)
28 4/7 weeks Some leaking continues, no bleeding, occasional cramp  VSS Afeb Lungs CTA Cor RRR Abd soft, NT FHT Cat one  U/S on admission-breech, 1175 gm, AFI 10.2 - 13%  A/P: PPROM         BR, Atb per protocol, BMTZ         Neonatology consult         MFM FU U/S today

## 2015-03-28 NOTE — Progress Notes (Signed)
After U/S this am, contractions noted about 5/hr with some red tinged fluid. Patient could feel them.  VSS Afeb Lungs CTA Cor RRR DTR 1+  Magnesium sulfate started    Now only irritability  A/P: D/W patient and husband above-magnesium for tocolysis for 24-48 hours to finish BMTZ course and also for CNS protection         All questions answered

## 2015-03-28 NOTE — Consult Note (Signed)
Neonatology Consult to Antenatal Patient:  I was asked by Dr. Gaetano Net to see this patient in order to provide antenatal counseling due to premature ROM at 28 4/7 weeks.  [name] was admitted yesterday and is now 21 4/[redacted] weeks GA. She had SROM yesterday and continues to leak fluid, somewhat blood-tinged today. She is currently not having active labor. She is getting BMZ and IV Ampicillin and Zithromax. This is her third baby and it is a girl.  I spoke with the patient and her husband. We discussed the worst case of delivery in the next 1-2 days, including usual DR management, possible respiratory complications and need for support, IV access, feedings (mother desires breast feeding, which was encouraged), LOS, Mortality and Morbidity, and long term outcomes. They had some questions, which I answered. I offered a NICU tour to any interested family members and would be glad to come back if they have  more questions later.  Thank you for asking me to see this patient.  Real Cons, MD Neonatologist  The total length of face-to-face or floor/unit time for this encounter was 25 minutes. Counseling and/or coordination of care was 15 minutes of the above.

## 2015-03-28 NOTE — Progress Notes (Addendum)
Dr. Gaetano Net notified regarding pt CBG 161mg /dl.  No new orders received.

## 2015-03-28 NOTE — H&P (Signed)
Paige Chambers is a 35 y.o. female presenting for SROM - clear fluid.  Pt denies ctx & vb.  Pregancny uncomplicated by GDM - well controlled with diet.  History OB History    Gravida Para Term Preterm AB TAB SAB Ectopic Multiple Living   4 2 2  1  1   2      Past Medical History  Diagnosis Date  . Hx of varicella   . Liver hemangioma   . Gestational diabetes    Past Surgical History  Procedure Laterality Date  . Wisdom tooth extraction     Family History: family history includes Heart disease in her father; Hypertension in her father and mother. Social History:  reports that she has never smoked. She has never used smokeless tobacco. She reports that she does not drink alcohol or use illicit drugs.   Prenatal Transfer Tool  Maternal Diabetes: Yes:  Diabetes Type:  Diet controlled Genetic Screening: Normal Maternal Ultrasounds/Referrals: Normal Fetal Ultrasounds or other Referrals:  None Maternal Substance Abuse:  No Significant Maternal Medications:  None Significant Maternal Lab Results:  None Other Comments:  None  ROS    Blood pressure 129/65, pulse 106, temperature 98.2 F (36.8 C), temperature source Oral, resp. rate 16, height 5\' 4"  (1.626 m), weight 150 lb (68.04 kg), last menstrual period 09/09/2014, unknown if currently breastfeeding. Exam Physical Exam  Gen - NAD Abd - gravid, NT Ext - NT, no edema Cvx - visually closed, clear fluid in vaginal vault  Prenatal labs: ABO, Rh: --/--/B POS (01/24 2105) Antibody: NEG (01/24 2105) Rubella:   RPR:    HBsAg:    HIV:    GBS:     Assessment/Plan: PPROM Admit BMZ Latency antibiotics - Amp/azithro OB ultrasound     Eluterio Seymour 03/28/2015, 6:02 AM

## 2015-03-29 LAB — GLUCOSE, CAPILLARY
GLUCOSE-CAPILLARY: 151 mg/dL — AB (ref 65–99)
GLUCOSE-CAPILLARY: 148 mg/dL — AB (ref 65–99)
Glucose-Capillary: 155 mg/dL — ABNORMAL HIGH (ref 65–99)
Glucose-Capillary: 150 mg/dL — ABNORMAL HIGH (ref 65–99)

## 2015-03-29 NOTE — Progress Notes (Signed)
Patient ID: Paige Chambers, female   DOB: June 21, 1980, 35 y.o.   MRN: [id number] Pt without complaints GFM Leaking clear fluid Rare Ctxs  VSSAF Glc levels elevated (SP BMZ) FHR 140s no decels Ctxs 0-2x.hr  Abd Gravid, nt Neg homans bil  [place] breech, AFI 5  PPROM at 28 5/7 S/P BMZ x 2.  Currently on latency abx Now on Mag for 48 hours for ctxs, and CP prophylaxix Breech - C/S for delivery Gest DM (A1) - now with elevated Glc.  Will cont to monitor and encourage Diabetic diet (ordered) If stays elevated, consider meds I had a lengthy discussion with [name] and her Husband re:  Plan of care and encouraged increased rest DL

## 2015-03-30 LAB — GLUCOSE, CAPILLARY
GLUCOSE-CAPILLARY: 103 mg/dL — AB (ref 65–99)
Glucose-Capillary: 117 mg/dL — ABNORMAL HIGH (ref 65–99)
Glucose-Capillary: 98 mg/dL (ref 65–99)
Glucose-Capillary: 109 mg/dL — ABNORMAL HIGH (ref 65–99)

## 2015-03-30 LAB — TYPE AND SCREEN
ABO/RH(D): B POS
ANTIBODY SCREEN: NEGATIVE

## 2015-03-30 NOTE — Progress Notes (Signed)
Patient is not feeling contractions.  I had her do a position change.  I will continue to monitor.

## 2015-03-30 NOTE — Progress Notes (Signed)
PROBLEM LIST:  1.  PPROM 1/24 at 4 pm 2.  Breech 3. A1 DM  S:  Patient is doing well. Occasional gushes of fluid.  Denies abdominal pain.  O:   BP 118/72 mmHg  Pulse 96  Temp(Src) 97.5 F (36.4 C) (Oral)  Resp 18  Ht 5\' 4"  (1.626 m)  Wt 148 lb 6.4 oz (67.314 kg)  BMI 25.46 kg/m2  SpO2 100%  LMP 09/09/2014 Results for orders placed or performed during the hospital encounter of 03/27/15 (from the past 24 hour(s))  Glucose, capillary     Status: Abnormal   Collection Time: 03/29/15 10:17 AM  Result Value Ref Range   Glucose-Capillary 151 (H) 65 - 99 mg/dL  Glucose, capillary     Status: Abnormal   Collection Time: 03/29/15  4:19 PM  Result Value Ref Range   Glucose-Capillary 148 (H) 65 - 99 mg/dL   Comment 1 Notify RN    Comment 2 Document in Chart   Glucose, capillary     Status: Abnormal   Collection Time: 03/29/15  9:26 PM  Result Value Ref Range   Glucose-Capillary 150 (H) 65 - 99 mg/dL  Glucose, capillary     Status: Abnormal   Collection Time: 03/30/15  7:35 AM  Result Value Ref Range   Glucose-Capillary 103 (H) 65 - 99 mg/dL   Comment 1 Notify RN    Comment 2 Document in Chart    Abdomen is soft and non tender  FHR is a Category 1  IMPRESSION:  1.  IUP at 28 w 6 days 2.  PPROM 3.  Breech 4.  A1 DM  PLAN:  Discontinue Magnesium today at 11 - 48 hours post betamethasone Monitor for signs of chorioamnionitis Continue Amoxicillin and Azithromycin Monitor CBGs C section for delivery if Breech presentation

## 2015-03-31 LAB — GLUCOSE, CAPILLARY
GLUCOSE-CAPILLARY: 88 mg/dL (ref 65–99)
GLUCOSE-CAPILLARY: 92 mg/dL (ref 65–99)
Glucose-Capillary: 111 mg/dL — ABNORMAL HIGH (ref 65–99)
Glucose-Capillary: 130 mg/dL — ABNORMAL HIGH (ref 65–99)

## 2015-03-31 NOTE — Progress Notes (Signed)
PROBLEM LIST:  1.  PPROM 1/24 at 4 pm 2.  Breech 3. A1 DM  S:  Patient is doing well. No complaints.  O:  BP 120/64 mmHg  Pulse 87  Temp(Src) 98.4 F (36.9 C) (Axillary)  Resp 16  Ht 5\' 4"  (1.626 m)  Wt 148 lb 6.4 oz (67.314 kg)  BMI 25.46 kg/m2  SpO2 100%  LMP 09/09/2014 Results for orders placed or performed during the hospital encounter of 03/27/15 (from the past 24 hour(s))  Glucose, capillary     Status: Abnormal   Collection Time: 03/30/15 10:28 AM  Result Value Ref Range   Glucose-Capillary 117 (H) 65 - 99 mg/dL   Comment 1 Notify RN    Comment 2 Document in Chart   Glucose, capillary     Status: None   Collection Time: 03/30/15  3:27 PM  Result Value Ref Range   Glucose-Capillary 98 65 - 99 mg/dL   Comment 1 Notify RN    Comment 2 Document in Chart   Type and screen [place]     Status: None   Collection Time: 03/30/15  8:05 PM  Result Value Ref Range   ABO/RH(D) B POS    Antibody Screen NEG    Sample Expiration 04/02/2015   Glucose, capillary     Status: Abnormal   Collection Time: 03/30/15 10:48 PM  Result Value Ref Range   Glucose-Capillary 109 (H) 65 - 99 mg/dL   Abdomen is soft and non tender  IMPRESSION: IUP at 29 weeks PPROM Breech A1DM  PLAN: Continue antibiotics Monitor CBGs Ultrasound on Wednesday for AFI

## 2015-04-01 LAB — GLUCOSE, CAPILLARY
GLUCOSE-CAPILLARY: 97 mg/dL (ref 65–99)
Glucose-Capillary: 118 mg/dL — ABNORMAL HIGH (ref 65–99)
Glucose-Capillary: 116 mg/dL — ABNORMAL HIGH (ref 65–99)
Glucose-Capillary: 121 mg/dL — ABNORMAL HIGH (ref 65–99)

## 2015-04-01 NOTE — Progress Notes (Signed)
1.  PPROM 2. Breech 3.  A1 DM 4.  Breech  S:  No interval events.  Good Fetal movement.  O:  BP 118/59 mmHg  Pulse 89  Temp(Src) 97.8 F (36.6 C) (Oral)  Resp 16  Ht 5\' 4"  (1.626 m)  Wt 148 lb 6.4 oz (67.314 kg)  BMI 25.46 kg/m2  SpO2 100%  LMP 09/09/2014 Abdomen is soft and non tender  Results for orders placed or performed during the hospital encounter of 03/27/15 (from the past 24 hour(s))  Glucose, capillary     Status: None   Collection Time: 03/31/15  8:11 AM  Result Value Ref Range   Glucose-Capillary 88 65 - 99 mg/dL   Comment 1 Document in Chart   Glucose, capillary     Status: None   Collection Time: 03/31/15 11:24 AM  Result Value Ref Range   Glucose-Capillary 92 65 - 99 mg/dL  Glucose, capillary     Status: Abnormal   Collection Time: 03/31/15  3:33 PM  Result Value Ref Range   Glucose-Capillary 111 (H) 65 - 99 mg/dL  Glucose, capillary     Status: Abnormal   Collection Time: 03/31/15  9:57 PM  Result Value Ref Range   Glucose-Capillary 130 (H) 65 - 99 mg/dL  Glucose, capillary     Status: None   Collection Time: 04/01/15  6:49 AM  Result Value Ref Range   Glucose-Capillary 97 65 - 99 mg/dL   Impression: IUP at 29 w 1 day PPROM Breech A1DM  PLAN: Continue antibiotics Monitor CBGs Ultrasound on Wednesday for AFI

## 2015-04-02 LAB — TYPE AND SCREEN
ABO/RH(D): B POS
ANTIBODY SCREEN: NEGATIVE

## 2015-04-02 LAB — GLUCOSE, CAPILLARY
GLUCOSE-CAPILLARY: 90 mg/dL (ref 65–99)
Glucose-Capillary: 99 mg/dL (ref 65–99)
Glucose-Capillary: 111 mg/dL — ABNORMAL HIGH (ref 65–99)
Glucose-Capillary: 160 mg/dL — ABNORMAL HIGH (ref 65–99)

## 2015-04-02 NOTE — Progress Notes (Signed)
No current c/o.  Feeling occasional gushes of fluid.  Active FM.  No f/c or abdominal pain.  No CTX.  Good glycemic control  VSS. AF. FHT Cat I Toco flat  Gen: A&O x 3 Abd: soft, NT Ext: no c/c/e  34yo [id number] at [redacted]w[redacted]d with PPROM -Monitor for infection -Breech-C/S for delivery -Continue latency abx -s/p BMZ -Repeat u/s for AFI Wednesday  Linda Hedges, DO

## 2015-04-03 LAB — GLUCOSE, CAPILLARY
GLUCOSE-CAPILLARY: 94 mg/dL (ref 65–99)
GLUCOSE-CAPILLARY: 108 mg/dL — AB (ref 65–99)
Glucose-Capillary: 103 mg/dL — ABNORMAL HIGH (ref 65–99)
Glucose-Capillary: 139 mg/dL — ABNORMAL HIGH (ref 65–99)

## 2015-04-03 NOTE — Progress Notes (Signed)
Patient ID: Paige Chambers, female   DOB: [birth year], 35 y.o.   MRN: [id number] Pt without complaints GFM occas Ctxs Leaking occas pink tinge  VSSAF  Glc levels good FHR 140s Ctxs 0-3x.hr  Abd Gravid nt Neg homans  34yo [id number] at [redacted]w[redacted]d with PPROM -Monitor for infection -Breech-C/S for delivery -Completes latency abx today -s/p BMZ -Repeat u/s for AFI Wednesday Gest DM - good control with diet

## 2015-04-04 ENCOUNTER — Inpatient Hospital Stay (HOSPITAL_COMMUNITY): Payer: PRIVATE HEALTH INSURANCE

## 2015-04-04 LAB — GLUCOSE, CAPILLARY
GLUCOSE-CAPILLARY: 106 mg/dL — AB (ref 65–99)
GLUCOSE-CAPILLARY: 113 mg/dL — AB (ref 65–99)
Glucose-Capillary: 109 mg/dL — ABNORMAL HIGH (ref 65–99)
Glucose-Capillary: 113 mg/dL — ABNORMAL HIGH (ref 65–99)

## 2015-04-04 NOTE — Progress Notes (Signed)
Pt denies c/o.  Occ ctx - no pain.  No vb.  Good FM  AF, VSS + FHT  Abd - fundus NT Ext - no edema  29+4 wks  1. PPROM - s/p BMZ & latency abx.  [place] today for AFI/position 2. GDM - glucose improved but fastings still a bit high (103/106).  Consider QHS glyburide 3. Breech - for c/section if labor/chorio 4. DVT prophylaxis - SCDs

## 2015-04-05 LAB — TYPE AND SCREEN
ABO/RH(D): B POS
Antibody Screen: NEGATIVE

## 2015-04-05 LAB — GLUCOSE, CAPILLARY
GLUCOSE-CAPILLARY: 115 mg/dL — AB (ref 65–99)
GLUCOSE-CAPILLARY: 107 mg/dL — AB (ref 65–99)
Glucose-Capillary: 95 mg/dL (ref 65–99)
Glucose-Capillary: 122 mg/dL — ABNORMAL HIGH (ref 65–99)

## 2015-04-05 NOTE — Progress Notes (Signed)
29 5/7 weeks + leaking, no bleeding  VSS Afeb Uterus soft, NT FHT +accels  U/S yesterday breech, oligo  FBS today = 95  A/P PPROM        Breech        GDM-glucose better this am         SCD

## 2015-04-05 NOTE — Discharge Instructions (Signed)

## 2015-04-06 LAB — GLUCOSE, CAPILLARY
GLUCOSE-CAPILLARY: 100 mg/dL — AB (ref 65–99)
GLUCOSE-CAPILLARY: 92 mg/dL (ref 65–99)
Glucose-Capillary: 131 mg/dL — ABNORMAL HIGH (ref 65–99)
Glucose-Capillary: 100 mg/dL — ABNORMAL HIGH (ref 65–99)

## 2015-04-06 NOTE — Progress Notes (Signed)
[redacted]w[redacted]d   S// still leaking O// BP 120/62 mmHg  Pulse 89  Temp(Src) 98.2 F (36.8 C) (Oral)  Resp 18  Ht 5\' 4"  (1.626 m)  Wt 146 lb 4.8 oz (66.361 kg)  BMI 25.10 kg/m2  SpO2 100%  LMP 09/09/2014  A+P// [redacted]w[redacted]d, PPROM/Breech, stable, follow CBG

## 2015-04-06 NOTE — Lactation Note (Signed)
Lactation Consultation Note  Patient Name: Paige Chambers [id number] Date: 04/06/2015   Mom [redacted]w[redacted]d antepartum. Mom states that she attempted to nurse first child, but baby did "not want to nurse." However, mom successfully nursed second child, and intends to nurse this baby. Mom given NICU booklet with review. Discussed process of pumping if baby sent to NICU, and made aware of Lactation support. Mom asked about pumping supplies and whether she would be able to "room-in" with baby if baby in NICU. All questions answered.   Maternal Data    Feeding    [facility] Score/Interventions                      Lactation Tools Discussed/Used     Consult Status      [name] 04/06/2015, 2:08 PM

## 2015-04-07 LAB — GLUCOSE, CAPILLARY
GLUCOSE-CAPILLARY: 93 mg/dL (ref 65–99)
GLUCOSE-CAPILLARY: 101 mg/dL — AB (ref 65–99)
Glucose-Capillary: 97 mg/dL (ref 65–99)
Glucose-Capillary: 101 mg/dL — ABNORMAL HIGH (ref 65–99)

## 2015-04-07 NOTE — Progress Notes (Signed)
Patient ID: Paige Chambers, female   DOB: 1981-01-22, 35 y.o.   MRN: [id number]   [redacted]w[redacted]d  S// no new C/o  O//BP 115/63 mmHg  Pulse 86  Temp(Src) 98.9 F (37.2 C) (Oral)  Resp 16  Ht 5\' 4"  (1.626 m)  Wt 146 lb 4.8 oz (66.361 kg)  BMI 25.10 kg/m2  SpO2 100%  LMP 09/09/2014  Stable FHR  A+P// [redacted]w[redacted]d, breech, PPROM, cont IPT obsv

## 2015-04-08 LAB — TYPE AND SCREEN
ABO/RH(D): B POS
ANTIBODY SCREEN: NEGATIVE

## 2015-04-08 LAB — GLUCOSE, CAPILLARY
GLUCOSE-CAPILLARY: 120 mg/dL — AB (ref 65–99)
GLUCOSE-CAPILLARY: 132 mg/dL — AB (ref 65–99)
Glucose-Capillary: 105 mg/dL — ABNORMAL HIGH (ref 65–99)
Glucose-Capillary: 107 mg/dL — ABNORMAL HIGH (ref 65–99)

## 2015-04-08 NOTE — Progress Notes (Signed)
[redacted]w[redacted]d   S// no new c/o  O//BP 116/55 mmHg  Pulse 85  Temp(Src) 98 F (36.7 C) (Oral)  Resp 16  Ht 5\' 4"  (1.626 m)  Wt 146 lb 4.8 oz (66.361 kg)  BMI 25.10 kg/m2  SpO2 100%  LMP 09/09/2014 A+P//[redacted]w[redacted]d, PPROM, Breech, stable

## 2015-04-08 NOTE — Progress Notes (Signed)
Patient called RN into room to assess spotting. All day today her discharge has been clear. Just now when returning from bathroom, she noticed some blood. RN assessed a very scant amount of pink-tinged spotting in underwear. RN educated patient that it is great to let us know when there is a change in discharge odor, color and amount. Will continue to assess and update as necessary. Patient without any other complaints at this time, and will inform RN of any further changes. Anival Pasha [place], [place] 04/08/2015 6:50 PM

## 2015-04-09 LAB — GLUCOSE, CAPILLARY
GLUCOSE-CAPILLARY: 108 mg/dL — AB (ref 65–99)
GLUCOSE-CAPILLARY: 99 mg/dL (ref 65–99)
GLUCOSE-CAPILLARY: 94 mg/dL (ref 65–99)
GLUCOSE-CAPILLARY: 141 mg/dL — AB (ref 65–99)

## 2015-04-09 LAB — CBC
HCT: 36.7 % (ref 36.0–46.0)
HEMOGLOBIN: 12.6 g/dL (ref 12.0–15.0)
MCH: 28.6 pg (ref 26.0–34.0)
MCHC: 34.3 g/dL (ref 30.0–36.0)
MCV: 83.4 fL (ref 78.0–100.0)
PLATELETS: 248 10*3/uL (ref 150–400)
RBC: 4.4 MIL/uL (ref 3.87–5.11)
RDW: 13.1 % (ref 11.5–15.5)
WBC: 10.7 10*3/uL — AB (ref 4.0–10.5)

## 2015-04-09 NOTE — Progress Notes (Signed)
No current c/o. Feeling occasional gushes of fluid. Active FM. No f/c or abdominal pain. No CTX.  Light pink d/c yesterday.  Good glycemic control  VSS. AF. FHT Cat I Toco flat  Gen: A&O x 3 Abd: soft, NT Ext: no c/c/e  34yo [id number] at [redacted]w[redacted]d with PPROM -Monitor for infection -Breech-C/S for delivery -s/p latency abx -s/p BMZ  Linda Hedges, DO

## 2015-04-10 LAB — GLUCOSE, CAPILLARY
GLUCOSE-CAPILLARY: 103 mg/dL — AB (ref 65–99)
GLUCOSE-CAPILLARY: 95 mg/dL (ref 65–99)
Glucose-Capillary: 96 mg/dL (ref 65–99)

## 2015-04-10 LAB — CBC
HEMATOCRIT: 36.1 % (ref 36.0–46.0)
HEMOGLOBIN: 12.4 g/dL (ref 12.0–15.0)
MCH: 28.1 pg (ref 26.0–34.0)
MCHC: 34.3 g/dL (ref 30.0–36.0)
MCV: 81.9 fL (ref 78.0–100.0)
Platelets: 242 10*3/uL (ref 150–400)
RBC: 4.41 MIL/uL (ref 3.87–5.11)
RDW: 13.1 % (ref 11.5–15.5)
WBC: 9.3 10*3/uL (ref 4.0–10.5)

## 2015-04-10 MED ORDER — LACTATED RINGERS IV BOLUS (SEPSIS)
250.0000 mL | Freq: Once | INTRAVENOUS | Status: AC
  Administered 2015-04-10: 250 mL via INTRAVENOUS

## 2015-04-10 MED ORDER — LACTATED RINGERS IV SOLN
INTRAVENOUS | Status: DC
  Administered 2015-04-10 (×2): via INTRAVENOUS

## 2015-04-10 NOTE — Progress Notes (Signed)
Feeling UCs. No vaginal pressure, no bleeding  Filed Vitals:   04/10/15 1549 04/10/15 1946  BP: 112/54 122/72  Pulse: 104 99  Temp: 99.1 F (37.3 C) 99.2 F (37.3 C)  Resp: 16 18   Uterus soft, NT Cx 2/80/high Bedside U/S breech  FHT cat one UCs q4-7 min, mild to palpation  A/P: CBC now         Continue IV fluids and fetal monitors          D/W patient - if UCs become stronger will proceed to delivery

## 2015-04-10 NOTE — Progress Notes (Signed)
30 3/7 weeks  Felt 5 UCs/2 hr this am No bleeding or pink D/C  VSS Afeb Uterus soft , NT FHT + accels  Results for orders placed or performed during the hospital encounter of 03/27/15 (from the past 24 hour(s))  Glucose, capillary     Status: None   Collection Time: 04/09/15 11:06 AM  Result Value Ref Range   Glucose-Capillary 99 65 - 99 mg/dL   Comment 1 Notify RN    Comment 2 Document in Chart   Glucose, capillary     Status: None   Collection Time: 04/09/15  3:20 PM  Result Value Ref Range   Glucose-Capillary 94 65 - 99 mg/dL   Comment 1 Notify RN    Comment 2 Document in Chart   CBC     Status: Abnormal   Collection Time: 04/09/15  5:23 PM  Result Value Ref Range   WBC 10.7 (H) 4.0 - 10.5 K/uL   RBC 4.40 3.87 - 5.11 MIL/uL   Hemoglobin 12.6 12.0 - 15.0 g/dL   HCT 36.7 36.0 - 46.0 %   MCV 83.4 78.0 - 100.0 fL   MCH 28.6 26.0 - 34.0 pg   MCHC 34.3 30.0 - 36.0 g/dL   RDW 13.1 11.5 - 15.5 %   Platelets 248 150 - 400 K/uL  Glucose, capillary     Status: Abnormal   Collection Time: 04/09/15 10:14 PM  Result Value Ref Range   Glucose-Capillary 141 (H) 65 - 99 mg/dL   A/P: PPROM         Breech-C/S for delivery         S/P BMTZ and latency ATB         IV fluids

## 2015-04-11 ENCOUNTER — Inpatient Hospital Stay (HOSPITAL_COMMUNITY): Payer: PRIVATE HEALTH INSURANCE

## 2015-04-11 LAB — GLUCOSE, CAPILLARY
GLUCOSE-CAPILLARY: 103 mg/dL — AB (ref 65–99)
GLUCOSE-CAPILLARY: 97 mg/dL (ref 65–99)
GLUCOSE-CAPILLARY: 97 mg/dL (ref 65–99)
GLUCOSE-CAPILLARY: 119 mg/dL — AB (ref 65–99)
Glucose-Capillary: 91 mg/dL (ref 65–99)

## 2015-04-11 LAB — TYPE AND SCREEN
ABO/RH(D): B POS
ANTIBODY SCREEN: NEGATIVE

## 2015-04-11 NOTE — Progress Notes (Signed)
Patient ID: Paige Chambers, female   DOB: 06-27-1980, 35 y.o.   MRN: [id number] Pt without complaints Occas mild ctxs. Pink tinge  VSSAF FHR 140s + accels Ctx 1-4x/h  Abd Gravid nt Neg homans bil   34yo [id number] at [redacted]w[redacted]d with PPROM -Monitor for infection -Breech-C/S for delivery -s/p latency abx -s/p BMZ [place] today for BPP.  EFW next week

## 2015-04-12 LAB — GLUCOSE, CAPILLARY
Glucose-Capillary: 84 mg/dL (ref 65–99)
Glucose-Capillary: 109 mg/dL — ABNORMAL HIGH (ref 65–99)
Glucose-Capillary: 90 mg/dL (ref 65–99)
Glucose-Capillary: 103 mg/dL — ABNORMAL HIGH (ref 65–99)

## 2015-04-12 NOTE — Progress Notes (Signed)
S:  Patient is doing well.  Occasional contractions at nighttime.  Some leakage of fluid last night and a little bit this morning that was pink stained.    O:  BP 116/59 mmHg  Pulse 91  Temp(Src) 98.2 F (36.8 C) (Oral)  Resp 16  Ht 5\' 4"  (1.626 m)  Wt 148 lb 11.2 oz (67.45 kg)  BMI 25.51 kg/m2  SpO2 97%  LMP 09/09/2014 Results for orders placed or performed during the hospital encounter of 03/27/15 (from the past 24 hour(s))  Glucose, capillary     Status: None   Collection Time: 04/11/15 11:38 AM  Result Value Ref Range   Glucose-Capillary 97 65 - 99 mg/dL  Glucose, capillary     Status: None   Collection Time: 04/11/15  2:51 PM  Result Value Ref Range   Glucose-Capillary 97 65 - 99 mg/dL   Comment 1 Notify RN    Comment 2 Document in Chart   Type and screen [place]     Status: None   Collection Time: 04/11/15  8:15 PM  Result Value Ref Range   ABO/RH(D) B POS    Antibody Screen NEG    Sample Expiration 04/14/2015   Glucose, capillary     Status: Abnormal   Collection Time: 04/11/15 10:24 PM  Result Value Ref Range   Glucose-Capillary 119 (H) 65 - 99 mg/dL   Comment 1 Notify RN    Abdomen is soft and non tender  No uterine tenderness  IMPRESSION: IUP at 30 w 5 days PPROM Breech  GDM  PLAN: Patient is stable Has received latency antibiotics and steroids Continue expectant management  C Section for delivery if still breech

## 2015-04-12 NOTE — Progress Notes (Signed)
Initial visit with Paige Chambers and her husband.  She shared that she's hoping to be in the hospital until delivery and just trying to hold tight.  She has two other children at home and it's hard to be away from them, but she's thankful that they come to visit and that her husband is home with them at night to try to keep their routine as normal as possible.    Please page as further needs arise.  Donald Prose. Elyn Peers, M.Div. [facility] Chaplain Pager [phone] Office [phone]      04/12/15 1456  Clinical Encounter Type  Visited With Patient and family together  Visit Type Initial  Referral From Chaplain  Consult/Referral To Chaplain  Spiritual Encounters  Spiritual Needs Emotional

## 2015-04-13 ENCOUNTER — Inpatient Hospital Stay (HOSPITAL_COMMUNITY): Payer: PRIVATE HEALTH INSURANCE | Admitting: Anesthesiology

## 2015-04-13 ENCOUNTER — Encounter (HOSPITAL_COMMUNITY): Payer: Self-pay | Admitting: Anesthesiology

## 2015-04-13 ENCOUNTER — Encounter (HOSPITAL_COMMUNITY)
Admission: AD | Disposition: A | Discharge status: Home / Self Care | Payer: Self-pay | Source: Ambulatory Visit | Attending: Obstetrics and Gynecology

## 2015-04-13 DIAGNOSIS — Z98891 History of uterine scar from previous surgery: Secondary | ICD-10-CM

## 2015-04-13 LAB — GLUCOSE, CAPILLARY
GLUCOSE-CAPILLARY: 100 mg/dL — AB (ref 65–99)
GLUCOSE-CAPILLARY: 94 mg/dL (ref 65–99)
Glucose-Capillary: 98 mg/dL (ref 65–99)

## 2015-04-13 SURGERY — Surgical Case
Anesthesia: Spinal | Site: Abdomen

## 2015-04-13 MED ORDER — KETOROLAC TROMETHAMINE 30 MG/ML IJ SOLN
INTRAMUSCULAR | Status: AC
  Filled 2015-04-13: qty 1

## 2015-04-13 MED ORDER — SENNOSIDES-DOCUSATE SODIUM 8.6-50 MG PO TABS
2.0000 | ORAL_TABLET | ORAL | Status: DC
  Administered 2015-04-15 – 2015-04-16 (×2): 2 via ORAL
  Filled 2015-04-13 (×2): qty 2

## 2015-04-13 MED ORDER — PHENYLEPHRINE 8 MG IN D5W 100 ML (0.08MG/ML) PREMIX OPTIME
INJECTION | INTRAVENOUS | Status: DC | PRN
  Administered 2015-04-13: 60 ug/min via INTRAVENOUS

## 2015-04-13 MED ORDER — OXYTOCIN 10 UNIT/ML IJ SOLN
40.0000 [IU] | INTRAVENOUS | Status: DC | PRN
  Administered 2015-04-13: 40 [IU] via INTRAVENOUS

## 2015-04-13 MED ORDER — MEPERIDINE HCL 25 MG/ML IJ SOLN: 6.2500 mg | INTRAMUSCULAR | Status: DC | PRN

## 2015-04-13 MED ORDER — FENTANYL CITRATE (PF) 100 MCG/2ML IJ SOLN
INTRAMUSCULAR | Status: AC
  Filled 2015-04-13: qty 2

## 2015-04-13 MED ORDER — TETANUS-DIPHTH-ACELL PERTUSSIS 5-2.5-18.5 LF-MCG/0.5 IM SUSP: 0.5000 mL | Freq: Once | INTRAMUSCULAR | Status: DC

## 2015-04-13 MED ORDER — FAMOTIDINE 20 MG PO TABS: 20.0000 mg | ORAL_TABLET | Freq: Every day | ORAL | Status: DC | PRN

## 2015-04-13 MED ORDER — ZOLPIDEM TARTRATE 5 MG PO TABS: 5.0000 mg | ORAL_TABLET | Freq: Every evening | ORAL | Status: DC | PRN

## 2015-04-13 MED ORDER — PRENATAL MULTIVITAMIN CH
1.0000 | ORAL_TABLET | Freq: Every day | ORAL | Status: DC
  Administered 2015-04-14 – 2015-04-16 (×3): 1 via ORAL
  Filled 2015-04-13 (×3): qty 1

## 2015-04-13 MED ORDER — NALBUPHINE HCL 10 MG/ML IJ SOLN: 5.0000 mg | INTRAMUSCULAR | Status: DC | PRN

## 2015-04-13 MED ORDER — MEDROXYPROGESTERONE ACETATE 150 MG/ML IM SUSP: 150.0000 mg | INTRAMUSCULAR | Status: DC | PRN

## 2015-04-13 MED ORDER — CITRIC ACID-SODIUM CITRATE 334-500 MG/5ML PO SOLN
ORAL | Status: AC
  Administered 2015-04-13: 30 mL
  Filled 2015-04-13: qty 15

## 2015-04-13 MED ORDER — ACETAMINOPHEN 325 MG PO TABS
650.0000 mg | ORAL_TABLET | ORAL | Status: DC | PRN
  Administered 2015-04-13: 650 mg via ORAL
  Filled 2015-04-13: qty 2

## 2015-04-13 MED ORDER — NALBUPHINE HCL 10 MG/ML IJ SOLN: 5.0000 mg | Freq: Once | INTRAMUSCULAR | Status: DC | PRN

## 2015-04-13 MED ORDER — DEXTROSE IN LACTATED RINGERS 5 % IV SOLN
INTRAVENOUS | Status: DC
Start: 2015-04-13 — End: 2015-04-16

## 2015-04-13 MED ORDER — SCOPOLAMINE 1 MG/3DAYS TD PT72
MEDICATED_PATCH | TRANSDERMAL | Status: DC | PRN
  Administered 2015-04-13: 1 via TRANSDERMAL

## 2015-04-13 MED ORDER — CEFAZOLIN SODIUM-DEXTROSE 2-3 GM-% IV SOLR
2.0000 g | Freq: Once | INTRAVENOUS | Status: DC
  Filled 2015-04-13: qty 50

## 2015-04-13 MED ORDER — IBUPROFEN 600 MG PO TABS
600.0000 mg | ORAL_TABLET | Freq: Four times a day (QID) | ORAL | Status: DC
  Administered 2015-04-14 – 2015-04-16 (×11): 600 mg via ORAL
  Filled 2015-04-13 (×11): qty 1

## 2015-04-13 MED ORDER — DEXAMETHASONE SODIUM PHOSPHATE 4 MG/ML IJ SOLN
INTRAMUSCULAR | Status: AC
  Filled 2015-04-13: qty 1

## 2015-04-13 MED ORDER — SIMETHICONE 80 MG PO CHEW
80.0000 mg | CHEWABLE_TABLET | ORAL | Status: DC
  Administered 2015-04-15 – 2015-04-16 (×2): 80 mg via ORAL
  Filled 2015-04-13 (×3): qty 1

## 2015-04-13 MED ORDER — ONDANSETRON HCL 4 MG/2ML IJ SOLN: 4.0000 mg | Freq: Three times a day (TID) | INTRAMUSCULAR | Status: DC | PRN

## 2015-04-13 MED ORDER — LANOLIN HYDROUS EX OINT: 1.0000 "application " | TOPICAL_OINTMENT | CUTANEOUS | Status: DC | PRN

## 2015-04-13 MED ORDER — DIPHENHYDRAMINE HCL 25 MG PO CAPS: 25.0000 mg | ORAL_CAPSULE | ORAL | Status: DC | PRN

## 2015-04-13 MED ORDER — DIPHENHYDRAMINE HCL 50 MG/ML IJ SOLN: 12.5000 mg | INTRAMUSCULAR | Status: DC | PRN

## 2015-04-13 MED ORDER — SIMETHICONE 80 MG PO CHEW: 80.0000 mg | CHEWABLE_TABLET | ORAL | Status: DC | PRN

## 2015-04-13 MED ORDER — OXYTOCIN 10 UNIT/ML IJ SOLN
INTRAMUSCULAR | Status: AC
  Filled 2015-04-13: qty 4

## 2015-04-13 MED ORDER — IBUPROFEN 600 MG PO TABS: 600.0000 mg | ORAL_TABLET | Freq: Four times a day (QID) | ORAL | Status: DC | PRN

## 2015-04-13 MED ORDER — SIMETHICONE 80 MG PO CHEW
80.0000 mg | CHEWABLE_TABLET | Freq: Three times a day (TID) | ORAL | Status: DC
  Administered 2015-04-14 – 2015-04-16 (×7): 80 mg via ORAL
  Filled 2015-04-13 (×7): qty 1

## 2015-04-13 MED ORDER — MEASLES, MUMPS & RUBELLA VAC ~~LOC~~ INJ: 0.5000 mL | INJECTION | Freq: Once | SUBCUTANEOUS | Status: DC

## 2015-04-13 MED ORDER — CEFAZOLIN SODIUM-DEXTROSE 2-3 GM-% IV SOLR
INTRAVENOUS | Status: DC | PRN
  Administered 2015-04-13: 2 g via INTRAVENOUS

## 2015-04-13 MED ORDER — DEXTROSE 5 % IV SOLN: 1.0000 ug/kg/h | INTRAVENOUS | Status: DC | PRN

## 2015-04-13 MED ORDER — MENTHOL 3 MG MT LOZG: 1.0000 | LOZENGE | OROMUCOSAL | Status: DC | PRN

## 2015-04-13 MED ORDER — BUPIVACAINE IN DEXTROSE 0.75-8.25 % IT SOLN
INTRATHECAL | Status: DC | PRN
  Administered 2015-04-13: 1.4 mL via INTRATHECAL

## 2015-04-13 MED ORDER — NALOXONE HCL 0.4 MG/ML IJ SOLN: 0.4000 mg | INTRAMUSCULAR | Status: DC | PRN

## 2015-04-13 MED ORDER — DIPHENHYDRAMINE HCL 25 MG PO CAPS: 25.0000 mg | ORAL_CAPSULE | Freq: Four times a day (QID) | ORAL | Status: DC | PRN

## 2015-04-13 MED ORDER — SODIUM CHLORIDE 0.9% FLUSH
3.0000 mL | INTRAVENOUS | Status: DC | PRN
  Administered 2015-04-14: 3 mL via INTRAVENOUS
  Filled 2015-04-13: qty 3

## 2015-04-13 MED ORDER — MORPHINE SULFATE (PF) 0.5 MG/ML IJ SOLN
INTRAMUSCULAR | Status: AC
  Filled 2015-04-13: qty 10

## 2015-04-13 MED ORDER — WITCH HAZEL-GLYCERIN EX PADS: 1.0000 "application " | MEDICATED_PAD | CUTANEOUS | Status: DC | PRN

## 2015-04-13 MED ORDER — OXYTOCIN 10 UNIT/ML IJ SOLN: 2.5000 [IU]/h | INTRAVENOUS | Status: AC

## 2015-04-13 MED ORDER — DEXAMETHASONE SODIUM PHOSPHATE 4 MG/ML IJ SOLN
INTRAMUSCULAR | Status: DC | PRN
  Administered 2015-04-13: 4 mg via INTRAVENOUS

## 2015-04-13 MED ORDER — ONDANSETRON HCL 4 MG/2ML IJ SOLN
INTRAMUSCULAR | Status: DC | PRN
  Administered 2015-04-13: 4 mg via INTRAVENOUS

## 2015-04-13 MED ORDER — DIBUCAINE 1 % RE OINT
1.0000 | TOPICAL_OINTMENT | RECTAL | Status: DC | PRN
Start: 2015-04-13 — End: 2015-04-16

## 2015-04-13 MED ORDER — FENTANYL CITRATE (PF) 100 MCG/2ML IJ SOLN: 25.0000 ug | INTRAMUSCULAR | Status: DC | PRN

## 2015-04-13 MED ORDER — SODIUM CHLORIDE 0.9 % IR SOLN
Status: DC | PRN
Start: 2015-04-13 — End: 2015-04-13
  Administered 2015-04-13: 1000 mL

## 2015-04-13 MED ORDER — ONDANSETRON HCL 4 MG/2ML IJ SOLN
INTRAMUSCULAR | Status: AC
  Filled 2015-04-13: qty 2

## 2015-04-13 MED ORDER — LACTATED RINGERS IV SOLN
INTRAVENOUS | Status: DC | PRN
  Administered 2015-04-13: 17:00:00 via INTRAVENOUS

## 2015-04-13 MED ORDER — KETOROLAC TROMETHAMINE 30 MG/ML IJ SOLN
30.0000 mg | Freq: Four times a day (QID) | INTRAMUSCULAR | Status: AC | PRN
  Administered 2015-04-13: 30 mg via INTRAMUSCULAR

## 2015-04-13 MED ORDER — KETOROLAC TROMETHAMINE 30 MG/ML IJ SOLN: 30.0000 mg | Freq: Four times a day (QID) | INTRAMUSCULAR | Status: AC | PRN

## 2015-04-13 MED ORDER — FENTANYL CITRATE (PF) 100 MCG/2ML IJ SOLN
INTRAMUSCULAR | Status: DC | PRN
  Administered 2015-04-13: 20 ug via INTRATHECAL

## 2015-04-13 MED ORDER — MORPHINE SULFATE (PF) 0.5 MG/ML IJ SOLN
INTRAMUSCULAR | Status: DC | PRN
  Administered 2015-04-13: .2 mg via INTRATHECAL

## 2015-04-13 SURGICAL SUPPLY — 29 items
CLAMP CORD UMBIL (MISCELLANEOUS) IMPLANT
CLOTH BEACON ORANGE TIMEOUT ST (SAFETY) ×3 IMPLANT
DRSG OPSITE POSTOP 4X10 (GAUZE/BANDAGES/DRESSINGS) ×3 IMPLANT
DURAPREP 26ML APPLICATOR (WOUND CARE) ×3 IMPLANT
ELECT REM PT RETURN 9FT ADLT (ELECTROSURGICAL) ×3
ELECTRODE REM PT RTRN 9FT ADLT (ELECTROSURGICAL) ×1 IMPLANT
EXTRACTOR VACUUM M CUP 4 TUBE (SUCTIONS) IMPLANT
EXTRACTOR VACUUM M CUP 4' TUBE (SUCTIONS)
GLOVE BIO SURGEON STRL SZ 6.5 (GLOVE) ×2 IMPLANT
GLOVE BIO SURGEONS STRL SZ 6.5 (GLOVE) ×1
GLOVE BIOGEL PI IND STRL 7.0 (GLOVE) ×2 IMPLANT
GLOVE BIOGEL PI INDICATOR 7.0 (GLOVE) ×4
GOWN STRL REUS W/TWL LRG LVL3 (GOWN DISPOSABLE) ×6 IMPLANT
KIT ABG SYR 3ML LUER SLIP (SYRINGE) IMPLANT
LIQUID BAND (GAUZE/BANDAGES/DRESSINGS) ×3 IMPLANT
NEEDLE HYPO 25X5/8 SAFETYGLIDE (NEEDLE) IMPLANT
NS IRRIG 1000ML POUR BTL (IV SOLUTION) ×3 IMPLANT
PACK C SECTION WH (CUSTOM PROCEDURE TRAY) ×3 IMPLANT
PAD OB MATERNITY 4.3X12.25 (PERSONAL CARE ITEMS) ×3 IMPLANT
PENCIL SMOKE EVAC W/HOLSTER (ELECTROSURGICAL) ×3 IMPLANT
SUT CHROMIC 0 CT 802H (SUTURE) IMPLANT
SUT CHROMIC 0 CTX 36 (SUTURE) ×9 IMPLANT
SUT MON AB-0 CT1 36 (SUTURE) ×3 IMPLANT
SUT PDS AB 0 CTX 60 (SUTURE) ×3 IMPLANT
SUT PLAIN 0 NONE (SUTURE) IMPLANT
SUT VIC AB 4-0 KS 27 (SUTURE) IMPLANT
SYR BULB 3OZ (MISCELLANEOUS) ×3 IMPLANT
TOWEL OR 17X24 6PK STRL BLUE (TOWEL DISPOSABLE) ×3 IMPLANT
TRAY FOLEY CATH SILVER 14FR (SET/KITS/TRAYS/PACK) IMPLANT

## 2015-04-13 NOTE — Progress Notes (Signed)
Occasional contractions and pressure.  + FM.  No VB  AF, VSS Gen - NAD Abd - gravid, NT Ext - NT  A/P:  PPROM, 30+6 wks Stable S/P BMZ & Abx C-section for delivery - breech

## 2015-04-13 NOTE — Anesthesia Preprocedure Evaluation (Signed)
Anesthesia Evaluation  Patient identified by MRN, date of birth, ID band Patient awake    Reviewed: Allergy & Precautions, NPO status , Patient's Chart, lab work & pertinent test results  Airway Mallampati: II  TM Distance: >3 FB Neck ROM: Full    Dental no notable dental hx. (+) Teeth Intact   Pulmonary neg pulmonary ROS,    Pulmonary exam normal breath sounds clear to auscultation       Cardiovascular negative cardio ROS Normal cardiovascular exam Rhythm:Regular Rate:Normal     Neuro/Psych negative neurological ROS  negative psych ROS   GI/Hepatic GERD  Medicated and Controlled,Liver Hemangioma   Endo/Other  diabetes, Well Controlled, Gestational  Renal/GU negative Renal ROS  negative genitourinary   Musculoskeletal negative musculoskeletal ROS (+)   Abdominal   Peds  Hematology   Anesthesia Other Findings   Reproductive/Obstetrics (+) Pregnancy PROM 30 6/7 weeks Active Labor Breech presentation                             Anesthesia Physical  Anesthesia Plan  ASA: II and emergent  Anesthesia Plan: Spinal   Post-op Pain Management:    Induction:   Airway Management Planned: Natural Airway  Additional Equipment:   Intra-op Plan:   Post-operative Plan:   Informed Consent: I have reviewed the patients History and Physical, chart, labs and discussed the procedure including the risks, benefits and alternatives for the proposed anesthesia with the patient or authorized representative who has indicated his/her understanding and acceptance.     Plan Discussed with: Anesthesiologist, CRNA and Surgeon  Anesthesia Plan Comments:         Anesthesia Quick Evaluation

## 2015-04-13 NOTE — Anesthesia Preprocedure Evaluation (Deleted)
Anesthesia Evaluation  Patient identified by MRN, date of birth, ID band Patient awake    Reviewed: Allergy & Precautions, NPO status , Patient's Chart, lab work & pertinent test results  Airway Mallampati: II  TM Distance: >3 FB Neck ROM: Full    Dental no notable dental hx. (+) Teeth Intact   Pulmonary neg pulmonary ROS,    Pulmonary exam normal breath sounds clear to auscultation       Cardiovascular negative cardio ROS Normal cardiovascular exam Rhythm:Regular Rate:Normal     Neuro/Psych negative neurological ROS  negative psych ROS   GI/Hepatic GERD  Medicated and Controlled,Liver Hemangioma   Endo/Other  diabetes, Well Controlled, Gestational  Renal/GU negative Renal ROS  negative genitourinary   Musculoskeletal negative musculoskeletal ROS (+)   Abdominal   Peds  Hematology   Anesthesia Other Findings   Reproductive/Obstetrics (+) Pregnancy PROM 30 6/7 weeks Active Labor Breech presentation                             Anesthesia Physical Anesthesia Plan  ASA: II and emergent  Anesthesia Plan: Spinal   Post-op Pain Management:    Induction:   Airway Management Planned: Natural Airway  Additional Equipment:   Intra-op Plan:   Post-operative Plan:   Informed Consent: I have reviewed the patients History and Physical, chart, labs and discussed the procedure including the risks, benefits and alternatives for the proposed anesthesia with the patient or authorized representative who has indicated his/her understanding and acceptance.     Plan Discussed with: Anesthesiologist, CRNA and Surgeon  Anesthesia Plan Comments:         Anesthesia Quick Evaluation

## 2015-04-13 NOTE — Progress Notes (Signed)
Pt c/o increase in ctx and pelvic pressure.  Sx worsened in past 30-13min.   + FHT Toco Q2-3 AF, vSS Abd - gravid, NT Ext - no edema Cvx 3.5cm, breech  A/P:  30+6 weeks PPROM, Labor, Breech rec c-section R/b/a discussed, questions answered, informed consent Ancef ordered

## 2015-04-13 NOTE — Anesthesia Postprocedure Evaluation (Signed)
Anesthesia Post Note  Patient: [name]  Procedure(s) Performed: Procedure(s) (LRB): CESAREAN SECTION (N/A)  Patient location during evaluation: PACU Anesthesia Type: Spinal Level of consciousness: awake Pain management: pain level controlled Vital Signs Assessment: post-procedure vital signs reviewed and stable Respiratory status: non-rebreather facemask and spontaneous breathing Cardiovascular status: stable Postop Assessment: no headache, no backache, spinal receding, patient able to bend at knees and no signs of nausea or vomiting Anesthetic complications: no    Last Vitals:  Filed Vitals:   04/13/15 1900 04/13/15 1915  BP: 123/50 122/76  Pulse:  82  Temp:  36.6 C  Resp: 22 19    Last Pain:  Filed Vitals:   04/13/15 1923  PainSc: 0-No pain                 Tiegan Jambor JR,JOHN Maleena Eddleman

## 2015-04-13 NOTE — Op Note (Signed)
Cesarean Section Procedure Note   Paige Chambers  03/27/2015 - 04/13/2015  Indications: PPROM, labor, breech   Pre-operative Diagnosis: primary cesarean for breech labor.   Post-operative Diagnosis: Same   Surgeon: Surgeon(s) and Role:    * Marylynn Pearson, MD - Primary   Assistants: none  Anesthesia: spinal   Procedure Details:  The patient was seen in the Holding Room. The risks, benefits, complications, treatment options, and expected outcomes were discussed with the patient. The patient concurred with the proposed plan, giving informed consent. identified as Paige Chambers and the procedure verified as C-Section Delivery. A Time Out was held and the above information confirmed.  After induction of anesthesia, the patient was draped and prepped in the usual sterile manner. A transverse was made and carried down through the subcutaneous tissue to the fascia. Fascial incision was made and extended transversely. The fascia was separated from the underlying rectus tissue superiorly and inferiorly. The peritoneum was identified and entered. Peritoneal incision was extended longitudinally. The utero-vesical peritoneal reflection was incised transversely and the bladder flap was bluntly freed from the lower uterine segment. A low transverse uterine incision was made. Delivered from breech footling presentation was a viable female. The placenta was removed manually . The uterine outline, tubes and ovaries appeared normal}. The uterine incision was closed with running locked sutures of 0chromic gut.   Hemostasis was observed. Lavage was carried out until clear.  Peritoneum was closed with monocryl. The fascia was then reapproximated with running sutures of 0PDS. The skin was closed with 4-0Vicryl.   Instrument, sponge, and needle counts were correct prior the abdominal closure and were correct at the conclusion of the case.     Estimated Blood Loss: 500cc   Urine Output: clear  Specimens:  placenta   Complications: no complications  Disposition: PACU - hemodynamically stable.   Maternal Condition: stable   Baby condition / location:  Couplet care / Skin to Skin  Attending Attestation: I was present and scrubbed for the entire procedure.   Signed: Surgeon(s): Marylynn Pearson, MD

## 2015-04-13 NOTE — Anesthesia Procedure Notes (Addendum)
Spinal Patient location during procedure: OR Start time: 04/13/2015 5:08 PM Staffing Anesthesiologist: Josephine Igo Performed by: anesthesiologist  Preanesthetic Checklist Completed: patient identified, site marked, surgical consent, pre-op evaluation, timeout performed, IV checked, risks and benefits discussed and monitors and equipment checked Spinal Block Patient position: sitting Prep: site prepped and draped and DuraPrep Patient monitoring: heart rate, cardiac monitor, continuous pulse ox and blood pressure Approach: midline Location: L3-4 Injection technique: single-shot Needle Needle type: Sprotte  Needle gauge: 24 G Needle length: 9 cm Needle insertion depth: 5 cm Assessment Sensory level: T4 Additional Notes Patient tolerated procedure well. Adequate sensory level.

## 2015-04-13 NOTE — Transfer of Care (Signed)
Immediate Anesthesia Transfer of Care Note  Patient: Paige Chambers  Procedure(s) Performed: Procedure(s): CESAREAN SECTION (N/A)  Patient Location: PACU  Anesthesia Type:Spinal  Level of Consciousness: awake, alert  and oriented  Airway & Oxygen Therapy: Patient Spontanous Breathing  Post-op Assessment: Report given to RN and Post -op Vital signs reviewed and stable  Post vital signs: Reviewed and stable  Last Vitals:  Filed Vitals:   04/13/15 1551 04/13/15 1552  BP:  123/64  Pulse:  93  Temp: 36.9 C   Resp: 16     Complications: No apparent anesthesia complications

## 2015-04-14 LAB — CBC
HCT: 33.2 % — ABNORMAL LOW (ref 36.0–46.0)
Hemoglobin: 11.5 g/dL — ABNORMAL LOW (ref 12.0–15.0)
MCH: 28.3 pg (ref 26.0–34.0)
MCHC: 34.6 g/dL (ref 30.0–36.0)
MCV: 81.8 fL (ref 78.0–100.0)
Platelets: 199 10*3/uL (ref 150–400)
RBC: 4.06 MIL/uL (ref 3.87–5.11)
RDW: 12.8 % (ref 11.5–15.5)
WBC: 12.5 10*3/uL — AB (ref 4.0–10.5)

## 2015-04-14 MED ORDER — OXYCODONE-ACETAMINOPHEN 5-325 MG PO TABS
1.0000 | ORAL_TABLET | ORAL | Status: DC | PRN
  Administered 2015-04-14 – 2015-04-16 (×8): 1 via ORAL
  Filled 2015-04-14 (×8): qty 1

## 2015-04-14 NOTE — Anesthesia Postprocedure Evaluation (Signed)
Anesthesia Post Note  Patient: Paige Chambers  Procedure(s) Performed: Procedure(s) (LRB): CESAREAN SECTION (N/A)  Patient location during evaluation: [facility] Anesthesia Type: Spinal Level of consciousness: awake and alert, oriented and patient cooperative Pain management: pain level controlled Vital Signs Assessment: post-procedure vital signs reviewed and stable Respiratory status: spontaneous breathing Cardiovascular status: stable Postop Assessment: no headache, patient able to bend at knees, no signs of nausea or vomiting and spinal receding Anesthetic complications: no Comments: Pt states pain level controlled at time of interview.    Last Vitals:  Filed Vitals:   04/14/15 0216 04/14/15 0546  BP: 109/54 96/48  Pulse: 76 73  Temp: 36.7 C 36.8 C  Resp: 18 18    Last Pain:  Filed Vitals:   04/14/15 0639  PainSc: 2                  [facility]

## 2015-04-14 NOTE — Plan of Care (Signed)
Problem: Coping: Goal: Ability to identify and utilize available resources and services will improve Tolerates a Regular diet well.  Problem: Life Cycle: Goal: Risk for postpartum hemorrhage will decrease Outcome: Completed/Met Date Met:  04/14/15 Vaginal bleeding is minimal. Goal: Chance of risk for complications during the postpartum period will decrease Outcome: Completed/Met Date Met:  04/14/15 Patient is up and ambulates frequently,tolerates a Regular diet .has passed flatus.vaginal bleeding is minimal,and takes pain medication mostly ATC Motrin.  Problem: Nutritional: Goal: Dietary intake will improve Outcome: Completed/Met Date Met:  04/14/15 Tolerates a Regular diet well. Goal: Mother's verbalization of comfort with breastfeeding process will improve Outcome: Completed/Met Date Met:  04/14/15 Pumping,infant is in NICU.  Problem: Role Relationship: Goal: Ability to demonstrate positive interaction with newborn will improve Outcome: Completed/Met Date Met:  04/14/15 Visits NICU frequently and has been allowed to do some skin to skin.  Problem: Bowel/Gastric: Goal: Gastrointestinal status will improve Outcome: Completed/Met Date Met:  04/14/15 Tolerates Regular diet and has passed flatus.  Problem: Urinary Elimination: Goal: Ability to reestablish a normal urinary elimination pattern will improve Outcome: Completed/Met Date Met:  04/14/15 Voids qs amounts without any difficulty.

## 2015-04-14 NOTE — Lactation Note (Signed)
This note was copied from a baby's chart. Lactation Consultation Note  Patient Name: Paige Chambers [id number] Date: 04/14/2015 Reason for consult: Initial assessment;NICU baby;Other (Comment);Infant < 6lbs (GA 30.6)   Initial consult with NICU mom at 47 hrs old; GA 30.6; BW 3 lbs, 1.4 oz.  PPROM; C-Section footling breech; R/O Sepsis on Antibiotics.   Mom is a P3 with GDM Diet controlled.  Experience breastfeeding 2 previous children; first child for 1 month and then quit d/t "jaundice;" second child for 1 year.  Reports delayed milk production at 5 days with both children.   Mom reports pumping at least three times today (every 3 hrs) with last pumping/HE getting 1 drop which she collected in small container.  Has yellow stickers, containers, and LC gave curved-tip syringe. Encouraged mom to try pumping at least every 2 hrs during the day and at least once at night for a minimum of 8-12 times per day.   Reviewed NICU booklet with mom; mom had already read booklet with underlining sentences in booklet. Offered to review hand expression but mom declined.  LC pointed out HE website in booklet and encouraged she watch video for review.   Mom has private insurance.  Stated she has a Medela PIS at home she plans to use.   Mom also stated the insurance company told her she would need a hospital-grade DEBP since baby was born preterm.  Mom states she plans to rent pump from insurance company after discharge "since [she] already has a PIS at home."  Stated the pump the insurance company rents is a Radio broadcast assistant.   LC discussed the differences between the [place] and the [employer].  Also discussed the pros/cons of renting vs obtaining new pump from insurance company and explained pump benefits as usually only paying for either a rental or new pump but not both.   Discussed option of 2 week rental hospital offers to give her time to decide which pump works best for her Best boy or [place]).   Mom  seems set on getting rental from [facility].  LC did not discuss further; did not give a rental packet. RN states mom is using #24 flanges with no complaints but has not been able to see mom pump as mom does not allow RN to observe and stays covered when pumping.   Encouraged mom to call for questions or concerns.  LC to follow-up PRN.      Maternal Data Does the patient have breastfeeding experience prior to this delivery?: Yes  Feeding    LATCH Score/Interventions                      Lactation Tools Discussed/Used WIC Program: No Pump Review: Setup, frequency, and cleaning;Milk Storage Initiated by:: RN Date initiated:: 04/13/15   Consult Status Consult Status: PRN    Merlene Laughter 04/14/2015, 3:34 PM

## 2015-04-14 NOTE — Addendum Note (Signed)
Addendum  created 04/14/15 0845 by Garner Nash, CRNA   Modules edited: Clinical Notes   Clinical Notes:  File: [id number]

## 2015-04-14 NOTE — Progress Notes (Signed)
Subjective: Postpartum Day 1: Cesarean Delivery Patient reports tolerating PO and no problems voiding.  C/o right shoulder pain.  Baby in NICU, doing well  Objective: Vital signs in last 24 hours: Temp:  [97.8 F (36.6 C)-98.8 F (37.1 C)] 98.3 F (36.8 C) (02/11 0546) Pulse Rate:  [73-97] 73 (02/11 0546) Resp:  [16-23] 18 (02/11 0546) BP: (96-125)/(48-76) 96/48 mmHg (02/11 0546) SpO2:  [96 %-100 %] 97 % (02/11 0546)  Physical Exam:  General: alert and cooperative Lochia: appropriate Uterine Fundus: firm Incision: healing well DVT Evaluation: No evidence of DVT seen on physical exam.   Recent Labs  04/14/15 0535  HGB 11.5*  HCT 33.2*    Assessment/Plan: Status post Cesarean section. Doing well postoperatively.  Continue current care.  Koda Defrank 04/14/2015, 9:00 AM

## 2015-04-15 ENCOUNTER — Encounter (HOSPITAL_COMMUNITY): Payer: Self-pay | Admitting: Obstetrics and Gynecology

## 2015-04-15 NOTE — Progress Notes (Addendum)
Subjective: Postpartum Day 2: Cesarean Delivery Patient reports incisional pain, + flatus and no problems voiding.  Baby doing well in NICU  Objective: Vital signs in last 24 hours: Temp:  [97.8 F (36.6 C)-98.7 F (37.1 C)] 98.7 F (37.1 C) (02/12 0550) Pulse Rate:  [73-78] 73 (02/12 0550) Resp:  [17-18] 17 (02/12 0550) BP: (97-119)/(53-62) 119/59 mmHg (02/12 0550) SpO2:  [98 %-100 %] 98 % (02/12 0550)  Physical Exam:  General: alert and cooperative Lochia: appropriate Uterine Fundus: firm Incision: no significant drainage DVT Evaluation: No evidence of DVT seen on physical exam.   Recent Labs  04/14/15 0535  HGB 11.5*  HCT 33.2*    Assessment/Plan: Status post Cesarean section. Doing well postoperatively.  Continue current care.  Paige Chambers 04/15/2015, 9:13 AM

## 2015-04-16 LAB — GLUCOSE, CAPILLARY: Glucose-Capillary: 93 mg/dL (ref 65–99)

## 2015-04-16 MED ORDER — IBUPROFEN 600 MG PO TABS: 600.0000 mg | ORAL_TABLET | Freq: Four times a day (QID) | ORAL | Status: DC

## 2015-04-16 MED ORDER — OXYCODONE-ACETAMINOPHEN 5-325 MG PO TABS: 1.0000 | ORAL_TABLET | ORAL | Status: DC | PRN

## 2015-04-16 NOTE — Lactation Note (Signed)
This note was copied from a baby's chart. Lactation Consultation Note  Patient Name: Girl Jnyah Otteson [id number] Date: 04/16/2015 Reason for consult: Follow-up assessment;NICU baby  NICU baby 24 hours old. Mom states that she was able to fill up a colostrum container earlier when she pumped and took it to the NICU for the baby. Enc mom to continue pumping 8 times/24 hours followed by hand expression. Mom had questions about when baby would be able to go to breast, which were answered. Enc mom to offer lots of STS and nuzzling/licking at the breast as she and baby able. Mom aware of OP/BFSG and [place] phone line assistance after D/C. Mom aware of pumping rooms in NICU, EBM storage guidelines, and how to transport EBM to the hospital.  Maternal Data Has patient been taught Hand Expression?: Yes (Per mom.)  Feeding Feeding Type: Breast Milk Length of feed: 30 min  LATCH Score/Interventions                      Lactation Tools Discussed/Used     Consult Status Consult Status: PRN    [name] 04/16/2015, 10:06 AM

## 2015-04-16 NOTE — Plan of Care (Signed)
Problem: Skin Integrity: Goal: Demonstration of wound healing without infection will improve Outcome: Completed/Met Date Met:  04/16/15 Incision care discussed and what to call the MD for.

## 2015-04-16 NOTE — Progress Notes (Signed)
CSW attempted to meet with MOB to introduce services, offer support, and complete assessment due to baby's admission to NICU, but she was not in her room at this time.  CSW will attempt again at a later time or when she is visiting baby in NICU.

## 2015-04-16 NOTE — Discharge Summary (Signed)
Obstetric Discharge Summary Reason for Admission: rupture of membranes Prenatal Procedures: ultrasound Intrapartum Procedures: cesarean: low cervical, transverse Postpartum Procedures: none Complications-Operative and Postpartum: none HEMOGLOBIN  Date Value Ref Range Status  04/14/2015 11.5* 12.0 - 15.0 g/dL Final   HCT  Date Value Ref Range Status  04/14/2015 33.2* 36.0 - 46.0 % Final    Physical Exam:  General: alert and cooperative Lochia: appropriate Uterine Fundus: firm Incision: healing well DVT Evaluation: No evidence of DVT seen on physical exam. Negative Homan's sign. No cords or calf tenderness. No significant calf/ankle edema.  Discharge Diagnoses: cesarean delivery @ 30 weeks  Discharge Information: Date: 04/16/2015 Activity: pelvic rest Diet: routine Medications: PNV, Ibuprofen and Percocet Condition: stable Instructions: refer to practice specific booklet Discharge to: home Follow-up Information    Follow up with [facility] Marjean Donna, MD On 04/09/2015.   Specialty:  Obstetrics and Gynecology   Why:  If symptoms worsen   Contact information:   [address] [place] [address] [phone]       Newborn Data: Live born female  Birth Weight: 3 lb 1.4 oz (1400 g) APGAR: 7, 8  Home with NICU.  Koree Staheli G 04/16/2015, 8:14 AM

## 2015-04-16 NOTE — Progress Notes (Signed)
Patient discharged home with husband... Discharge instructions reviewed with patient and she verbalized understanding... Condition stable... No equipment... Ambulated to car with Santiago Bur, NT.

## 2015-04-17 LAB — RPR: RPR Ser Ql: NONREACTIVE

## 2015-04-18 ENCOUNTER — Inpatient Hospital Stay (HOSPITAL_COMMUNITY): Admit: 2015-04-18 | Payer: Self-pay

## 2015-04-20 ENCOUNTER — Ambulatory Visit: Payer: Self-pay

## 2015-04-20 NOTE — Lactation Note (Signed)
This note was copied from a baby's chart. Lactation Consultation Note  Met with mom in the NICU.  She is pumping every 2 hours during the day and every 3-4 hours at night.  Randel Books is one week old and her supply is abundant.  Mom is currently using a lactina pump but plans on switching to her pump in style at 2-3 weeks.  Encouraged to call with concerns prn.  Patient Name: Paige Chambers [id number] Date: 04/20/2015     Maternal Data    Feeding Feeding Type: Breast Milk Length of feed: 30 min  LATCH Score/Interventions                      Lactation Tools Discussed/Used     Consult Status      Ave Filter 04/20/2015, 2:31 PM

## 2015-05-08 ENCOUNTER — Ambulatory Visit: Payer: Self-pay

## 2015-05-08 NOTE — Lactation Note (Signed)
This note was copied from a baby's chart. Lactation Consultation Note  Follow up with mom in the NICU.  Mom states baby has been going to breast for 10 minute feeds and she would like to do a pre and post weight.  Mom has a good milk supply.  Assisted with positioning baby in cross cradle hold.  Milk dripping into baby's mouth.  Baby is sleepy and not showing much interest in feeding.  She did latch a few times with 2-3 soft sucks.  No milk transfer at breast.  Reassured mom this is normal feeding behavior for 34.3 days.  Encouraged to continue putting baby to breast and we can do a test weigh in 1 week.  Patient Name: Paige Chambers [id number] Date: 05/08/2015 Reason for consult: Follow-up assessment;NICU baby   Maternal Data    Feeding Feeding Type: Breast Milk Length of feed: 60 min  LATCH Score/Interventions Latch: Repeated attempts needed to sustain latch, nipple held in mouth throughout feeding, stimulation needed to elicit sucking reflex. Intervention(s): Adjust position;Assist with latch;Breast massage;Breast compression  Audible Swallowing: None  Type of Nipple: Everted at rest and after stimulation  Comfort (Breast/Nipple): Soft / non-tender     Hold (Positioning): Assistance needed to correctly position infant at breast and maintain latch. Intervention(s): Breastfeeding basics reviewed;Support Pillows;Position options;Skin to skin  LATCH Score: 6  Lactation Tools Discussed/Used     Consult Status Consult Status: PRN    Ave Filter 05/08/2015, 2:29 PM

## 2015-05-22 ENCOUNTER — Ambulatory Visit: Payer: Self-pay

## 2015-05-22 NOTE — Lactation Note (Signed)
This note was copied from a baby's chart. Lactation Consultation Note  Mom states she hasn't put baby to breast in the past few days because they are working on the bottle.  Recommended she attempt 1-2 times per day but limit time to 10 minutes so baby doesn't tire.  Mom has returned to work but continues pumping and obtaining a good supply.  Will do a pre and post weight when baby becomes more effective.  Observed mom latch baby using cradle hold.  Baby latches but not very deep.  Baby slips off the breast and needs relatched often.  Reassurance given.  Patient Name: Paige Chambers [id number] Date: 05/22/2015 Reason for consult: Follow-up assessment;NICU baby   Maternal Data    Feeding Feeding Type: Breast Fed Nipple Type: Slow - flow Length of feed: 10 min  LATCH Score/Interventions Latch: Repeated attempts needed to sustain latch, nipple held in mouth throughout feeding, stimulation needed to elicit sucking reflex. Intervention(s): Teach feeding cues;Waking techniques Intervention(s): Adjust position;Assist with latch;Breast massage;Breast compression  Audible Swallowing: Spontaneous and intermittent Intervention(s): Alternate breast massage  Type of Nipple: Everted at rest and after stimulation  Comfort (Breast/Nipple): Soft / non-tender     Hold (Positioning): No assistance needed to correctly position infant at breast. Intervention(s): Breastfeeding basics reviewed;Support Pillows;Position options  LATCH Score: 9  Lactation Tools Discussed/Used     Consult Status Consult Status: Follow-up Date: 05/23/15    Ave Filter 05/22/2015, 3:41 PM

## 2016-10-03 ENCOUNTER — Ambulatory Visit (HOSPITAL_COMMUNITY)
Admission: AD | Admit: 2016-10-03 | Discharge: 2016-10-03 | Disposition: A | Discharge status: Home / Self Care | Payer: PRIVATE HEALTH INSURANCE | Source: Ambulatory Visit | Attending: Obstetrics and Gynecology | Admitting: Obstetrics and Gynecology

## 2016-10-03 ENCOUNTER — Inpatient Hospital Stay (HOSPITAL_COMMUNITY): Payer: PRIVATE HEALTH INSURANCE | Admitting: Anesthesiology

## 2016-10-03 ENCOUNTER — Encounter (HOSPITAL_COMMUNITY)
Admission: AD | Disposition: A | Discharge status: Home / Self Care | Payer: Self-pay | Source: Ambulatory Visit | Attending: Obstetrics and Gynecology

## 2016-10-03 ENCOUNTER — Inpatient Hospital Stay (HOSPITAL_COMMUNITY): Payer: PRIVATE HEALTH INSURANCE

## 2016-10-03 ENCOUNTER — Encounter (HOSPITAL_COMMUNITY): Payer: Self-pay

## 2016-10-03 DIAGNOSIS — Z79899 Other long term (current) drug therapy: Secondary | ICD-10-CM | POA: Insufficient documentation

## 2016-10-03 DIAGNOSIS — O034 Incomplete spontaneous abortion without complication: Secondary | ICD-10-CM

## 2016-10-03 DIAGNOSIS — D649 Anemia, unspecified: Secondary | ICD-10-CM | POA: Diagnosis not present

## 2016-10-03 DIAGNOSIS — E119 Type 2 diabetes mellitus without complications: Secondary | ICD-10-CM | POA: Insufficient documentation

## 2016-10-03 DIAGNOSIS — O021 Missed abortion: Secondary | ICD-10-CM | POA: Insufficient documentation

## 2016-10-03 DIAGNOSIS — O031 Delayed or excessive hemorrhage following incomplete spontaneous abortion: Secondary | ICD-10-CM

## 2016-10-03 HISTORY — DX: Preterm premature rupture of membranes, unspecified as to length of time between rupture and onset of labor, unspecified trimester: O42.919

## 2016-10-03 HISTORY — PX: DILATION AND CURETTAGE OF UTERUS: SHX78

## 2016-10-03 LAB — URINALYSIS, ROUTINE W REFLEX MICROSCOPIC
BILIRUBIN URINE: NEGATIVE
Bacteria, UA: NONE SEEN
GLUCOSE, UA: NEGATIVE mg/dL
Ketones, ur: 5 mg/dL — AB
LEUKOCYTES UA: NEGATIVE
NITRITE: NEGATIVE
PH: 5 (ref 5.0–8.0)
Protein, ur: 100 mg/dL — AB
SPECIFIC GRAVITY, URINE: 1.032 — AB (ref 1.005–1.030)
SQUAMOUS EPITHELIAL / LPF: NONE SEEN

## 2016-10-03 LAB — COMPREHENSIVE METABOLIC PANEL
ALT: 17 U/L (ref 14–54)
ANION GAP: 5 (ref 5–15)
AST: 21 U/L (ref 15–41)
Albumin: 3.7 g/dL (ref 3.5–5.0)
Alkaline Phosphatase: 52 U/L (ref 38–126)
BUN: 18 mg/dL (ref 6–20)
CHLORIDE: 106 mmol/L (ref 101–111)
CO2: 25 mmol/L (ref 22–32)
Calcium: 8.7 mg/dL — ABNORMAL LOW (ref 8.9–10.3)
Creatinine, Ser: 0.61 mg/dL (ref 0.44–1.00)
Glucose, Bld: 149 mg/dL — ABNORMAL HIGH (ref 65–99)
POTASSIUM: 3.7 mmol/L (ref 3.5–5.1)
SODIUM: 136 mmol/L (ref 135–145)
Total Bilirubin: 0.8 mg/dL (ref 0.3–1.2)
Total Protein: 6.6 g/dL (ref 6.5–8.1)

## 2016-10-03 LAB — CBC
HCT: 28.2 % — ABNORMAL LOW (ref 36.0–46.0)
HCT: 30.2 % — ABNORMAL LOW (ref 36.0–46.0)
HEMOGLOBIN: 9.6 g/dL — AB (ref 12.0–15.0)
HEMOGLOBIN: 10.2 g/dL — AB (ref 12.0–15.0)
MCH: 28.6 pg (ref 26.0–34.0)
MCH: 28.1 pg (ref 26.0–34.0)
MCHC: 34 g/dL (ref 30.0–36.0)
MCHC: 33.8 g/dL (ref 30.0–36.0)
MCV: 83.9 fL (ref 78.0–100.0)
MCV: 83.2 fL (ref 78.0–100.0)
Platelets: 187 10*3/uL (ref 150–400)
Platelets: 231 10*3/uL (ref 150–400)
RBC: 3.36 MIL/uL — AB (ref 3.87–5.11)
RBC: 3.63 MIL/uL — AB (ref 3.87–5.11)
RDW: 12.5 % (ref 11.5–15.5)
RDW: 12.4 % (ref 11.5–15.5)
WBC: 8 10*3/uL (ref 4.0–10.5)
WBC: 9.1 10*3/uL (ref 4.0–10.5)

## 2016-10-03 LAB — TYPE AND SCREEN
ABO/RH(D): B POS
ANTIBODY SCREEN: NEGATIVE

## 2016-10-03 LAB — HCG, QUANTITATIVE, PREGNANCY: HCG, BETA CHAIN, QUANT, S: 74 m[IU]/mL — AB (ref <5)

## 2016-10-03 LAB — POCT PREGNANCY, URINE: Preg Test, Ur: POSITIVE — AB

## 2016-10-03 SURGERY — DILATION AND CURETTAGE
Anesthesia: General | Site: Vagina

## 2016-10-03 MED ORDER — OXYCODONE HCL 5 MG/5ML PO SOLN: 5.0000 mg | Freq: Once | ORAL | Status: DC | PRN

## 2016-10-03 MED ORDER — SOD CITRATE-CITRIC ACID 500-334 MG/5ML PO SOLN
ORAL | Status: AC
  Administered 2016-10-03: 30 mL via ORAL
  Filled 2016-10-03: qty 15

## 2016-10-03 MED ORDER — MIDAZOLAM HCL 2 MG/2ML IJ SOLN
INTRAMUSCULAR | Status: DC | PRN
  Administered 2016-10-03: 2 mg via INTRAVENOUS

## 2016-10-03 MED ORDER — ACETAMINOPHEN 325 MG PO TABS: 325.0000 mg | ORAL_TABLET | ORAL | Status: DC | PRN

## 2016-10-03 MED ORDER — MIDAZOLAM HCL 2 MG/2ML IJ SOLN
INTRAMUSCULAR | Status: AC
  Filled 2016-10-03: qty 2

## 2016-10-03 MED ORDER — FAMOTIDINE IN NACL 20-0.9 MG/50ML-% IV SOLN
20.0000 mg | Freq: Once | INTRAVENOUS | Status: AC
  Administered 2016-10-03: 20 mg via INTRAVENOUS

## 2016-10-03 MED ORDER — LIDOCAINE HCL (CARDIAC) 20 MG/ML IV SOLN
INTRAVENOUS | Status: DC | PRN
  Administered 2016-10-03: 100 mg via INTRAVENOUS

## 2016-10-03 MED ORDER — ONDANSETRON HCL 4 MG/2ML IJ SOLN: 4.0000 mg | Freq: Once | INTRAMUSCULAR | Status: DC | PRN

## 2016-10-03 MED ORDER — FENTANYL CITRATE (PF) 100 MCG/2ML IJ SOLN: 25.0000 ug | INTRAMUSCULAR | Status: DC | PRN

## 2016-10-03 MED ORDER — KETOROLAC TROMETHAMINE 30 MG/ML IJ SOLN: 30.0000 mg | Freq: Once | INTRAMUSCULAR | Status: DC | PRN

## 2016-10-03 MED ORDER — DOXYCYCLINE HYCLATE 100 MG IV SOLR
100.0000 mg | Freq: Once | INTRAVENOUS | Status: AC
  Administered 2016-10-03 (×2): 100 mg via INTRAVENOUS
  Filled 2016-10-03: qty 100

## 2016-10-03 MED ORDER — KETOROLAC TROMETHAMINE 30 MG/ML IJ SOLN
INTRAMUSCULAR | Status: AC
  Filled 2016-10-03: qty 1

## 2016-10-03 MED ORDER — FENTANYL CITRATE (PF) 100 MCG/2ML IJ SOLN
INTRAMUSCULAR | Status: AC
  Filled 2016-10-03: qty 2

## 2016-10-03 MED ORDER — ACETAMINOPHEN 160 MG/5ML PO SOLN: 325.0000 mg | ORAL | Status: DC | PRN

## 2016-10-03 MED ORDER — ONDANSETRON HCL 4 MG/2ML IJ SOLN
INTRAMUSCULAR | Status: AC
  Filled 2016-10-03: qty 2

## 2016-10-03 MED ORDER — OXYCODONE HCL 5 MG PO TABS: 5.0000 mg | ORAL_TABLET | Freq: Once | ORAL | Status: DC | PRN

## 2016-10-03 MED ORDER — KETOROLAC TROMETHAMINE 30 MG/ML IJ SOLN
INTRAMUSCULAR | Status: DC | PRN
  Administered 2016-10-03: 30 mg via INTRAVENOUS

## 2016-10-03 MED ORDER — LIDOCAINE HCL (PF) 1 % IJ SOLN
INTRAMUSCULAR | Status: DC | PRN
  Administered 2016-10-03: 20 mL

## 2016-10-03 MED ORDER — PROPOFOL 10 MG/ML IV BOLUS
INTRAVENOUS | Status: DC | PRN
  Administered 2016-10-03: 150 mg via INTRAVENOUS

## 2016-10-03 MED ORDER — FENTANYL CITRATE (PF) 250 MCG/5ML IJ SOLN
INTRAMUSCULAR | Status: DC | PRN
  Administered 2016-10-03 (×2): 50 ug via INTRAVENOUS

## 2016-10-03 MED ORDER — LACTATED RINGERS IV BOLUS (SEPSIS): 1000.0000 mL | Freq: Once | INTRAVENOUS | Status: DC

## 2016-10-03 MED ORDER — DEXAMETHASONE SODIUM PHOSPHATE 4 MG/ML IJ SOLN
INTRAMUSCULAR | Status: AC
  Filled 2016-10-03: qty 1

## 2016-10-03 MED ORDER — LACTATED RINGERS IV SOLN
INTRAVENOUS | Status: DC
  Administered 2016-10-03: 07:00:00 via INTRAVENOUS

## 2016-10-03 MED ORDER — SOD CITRATE-CITRIC ACID 500-334 MG/5ML PO SOLN
30.0000 mL | Freq: Once | ORAL | Status: AC
  Administered 2016-10-03: 30 mL via ORAL

## 2016-10-03 MED ORDER — DEXAMETHASONE SODIUM PHOSPHATE 4 MG/ML IJ SOLN
INTRAMUSCULAR | Status: DC | PRN
  Administered 2016-10-03: 4 mg via INTRAVENOUS

## 2016-10-03 MED ORDER — FAMOTIDINE IN NACL 20-0.9 MG/50ML-% IV SOLN
INTRAVENOUS | Status: AC
  Filled 2016-10-03: qty 50

## 2016-10-03 MED ORDER — LACTATED RINGERS IV BOLUS (SEPSIS)
1000.0000 mL | Freq: Once | INTRAVENOUS | Status: AC
  Administered 2016-10-03: 1000 mL via INTRAVENOUS

## 2016-10-03 MED ORDER — MEPERIDINE HCL 25 MG/ML IJ SOLN: 6.2500 mg | INTRAMUSCULAR | Status: DC | PRN

## 2016-10-03 MED ORDER — OXYCODONE-ACETAMINOPHEN 5-325 MG PO TABS: 1.0000 | ORAL_TABLET | Freq: Four times a day (QID) | ORAL | 0 refills | Status: DC | PRN

## 2016-10-03 MED ORDER — LIDOCAINE HCL (CARDIAC) 20 MG/ML IV SOLN
INTRAVENOUS | Status: AC
  Filled 2016-10-03: qty 5

## 2016-10-03 SURGICAL SUPPLY — 19 items
CATH ROBINSON RED A/P 16FR (CATHETERS) ×2 IMPLANT
CLOTH BEACON ORANGE TIMEOUT ST (SAFETY) ×2 IMPLANT
CONTAINER PREFILL 10% NBF 60ML (FORM) ×4 IMPLANT
GLOVE BIOGEL PI IND STRL 7.0 (GLOVE) ×1 IMPLANT
GLOVE BIOGEL PI IND STRL 7.5 (GLOVE) ×1 IMPLANT
GLOVE BIOGEL PI INDICATOR 7.0 (GLOVE) ×1
GLOVE BIOGEL PI INDICATOR 7.5 (GLOVE) ×1
GLOVE SURG SS PI 7.0 STRL IVOR (GLOVE) ×2 IMPLANT
GOWN STRL REUS W/TWL LRG LVL3 (GOWN DISPOSABLE) ×2 IMPLANT
GOWN STRL REUS W/TWL XL LVL3 (GOWN DISPOSABLE) ×2 IMPLANT
KIT BERKELEY 1ST TRIMESTER 3/8 (MISCELLANEOUS) ×2 IMPLANT
NS IRRIG 1000ML POUR BTL (IV SOLUTION) ×2 IMPLANT
PACK VAGINAL MINOR WOMEN LF (CUSTOM PROCEDURE TRAY) ×2 IMPLANT
PAD OB MATERNITY 4.3X12.25 (PERSONAL CARE ITEMS) ×2 IMPLANT
PAD PREP 24X48 CUFFED NSTRL (MISCELLANEOUS) ×2 IMPLANT
SET BERKELEY SUCTION TUBING (SUCTIONS) ×2 IMPLANT
TOP DISP BERKELEY (MISCELLANEOUS) ×2 IMPLANT
TOWEL OR 17X24 6PK STRL BLUE (TOWEL DISPOSABLE) ×4 IMPLANT
VACURETTE 8 RIGID CVD (CANNULA) ×2 IMPLANT

## 2016-10-03 NOTE — Transfer of Care (Signed)
Immediate Anesthesia Transfer of Care Note  Patient: Paige Chambers  Procedure(s) Performed: Procedure(s): DILATATION AND CURETTAGE (N/A)  Patient Location: PACU  Anesthesia Type:General  Level of Consciousness: awake, alert  and oriented  Airway & Oxygen Therapy: Patient Spontanous Breathing and Patient connected to nasal cannula oxygen  Post-op Assessment: Report given to RN and Post -op Vital signs reviewed and stable  Post vital signs: Reviewed and stable  Last Vitals:  Vitals:   10/03/16 0247 10/03/16 0628  BP: (!) 101/59 (!) 98/52  Pulse: 94 84  Resp:  18  Temp:  37.1 C    Last Pain:  Vitals:   10/03/16 0628  TempSrc: Oral  PainSc:          Complications: No apparent anesthesia complications

## 2016-10-03 NOTE — H&P (Signed)
Obstetrics & Gynecology H&P   Date of Admission: 10/03/2016   Requesting Provider: MAU  Primary OBGYN: [facility]  Primary Care Provider: Patient, No Pcp Per  Reason for Admission: VB  History of Present Illness: Paige Chambers is a 36 y.o. [phone] (Patient's last menstrual period was 06/26/2016.), with the above CC. PMHx is significant for AMA, early GDM diagnosis.  Patient followed at [facility] and had missed AB diagnosed on 7/11 and she chose medical management with cytotec. I cant read the actual note or see the u/s read but a telephone note from last night states that an u/s on 7/20 was clear.  She is slightly anemic and u/s shows concern for rPOCs  No fevers, chills. Patient did note some VB when she went to the bathroom in u/s.   ROS: A 12-point review of systems was performed and negative, except as stated in the above HPI.  OBGYN History: As per HPI. OB History  Gravida Para Term Preterm AB Living  5 3 2 1 2 3   SAB TAB Ectopic Multiple Live Births  1     0 3    # Outcome Date GA Lbr Len/2nd Weight Sex Delivery Anes PTL Lv  5 Preterm 04/13/15 [redacted]w[redacted]d  1.4 kg (3 lb 1.4 oz) F CS-LTranv Spinal  LIV  4 Term 04/02/13 [redacted]w[redacted]d 01:22 / 00:15 3.116 kg (6 lb 13.9 oz) M Vag-Spont None  LIV  3 Term 2010 [redacted]w[redacted]d 07:00 2.948 kg (6 lb 8 oz) M Vag-Spont EPI  LIV  2 AB           1 SAB              Birth Comments: System Generated. Please review and update pregnancy details.       Past Medical History: Past Medical History:  Diagnosis Date  . Gestational diabetes   . Hx of varicella   . Liver hemangioma   . Preterm premature rupture of membranes (PPROM) with unknown onset of labor 03/27/2015    Past Surgical History: Past Surgical History:  Procedure Laterality Date  . CESAREAN SECTION N/A 04/13/2015   Procedure: CESAREAN SECTION;  Surgeon: Marylynn Pearson, MD;  Location: [place] [facility];  Service: Obstetrics;  Laterality: N/A;  . WISDOM TOOTH EXTRACTION      Family History:  Family History   Problem Relation Age of Onset  . Hypertension Father   . Heart disease Father   . Hypertension Mother     Social History:  Social History   Social History  . Marital status: Married    Spouse name: N/A  . Number of children: N/A  . Years of education: N/A   Occupational History  . Not on file.   Social History Main Topics  . Smoking status: Never Smoker  . Smokeless tobacco: Never Used  . Alcohol use Yes     Comment: occasional  . Drug use: No  . Sexual activity: Not Currently   Other Topics Concern  . Not on file   Social History Narrative  . No narrative on file    Allergy: Allergies  Allergen Reactions  . Iohexol Hives and Nausea And Vomiting         Current Outpatient Medications: Prescriptions Prior to Admission  Medication Sig Dispense Refill Last Dose  . ibuprofen (ADVIL,MOTRIN) 600 MG tablet Take 1 tablet (600 mg total) by mouth every 6 (six) hours. 30 tablet 1 More than a month at Unknown time  . oxyCODONE-acetaminophen (PERCOCET/ROXICET) 5-325 MG tablet Take  1-2 tablets by mouth every 4 (four) hours as needed for moderate pain. 30 tablet 0 More than a month at Unknown time  . Prenatal Vit-Fe Fumarate-FA (PRENATAL MULTIVITAMIN) TABS tablet Take 1 tablet by mouth daily at 12 noon.   More than a month at Unknown time     Hospital Medications: Current Facility-Administered Medications  Medication Dose Route Frequency Provider Last Rate Last Dose  . lactated ringers bolus 1,000 mL  1,000 mL Intravenous Once [place], [place], CNM         Physical Exam:   Current Vital Signs 24h Vital Sign Ranges  T 97.9 F (36.6 C) Temp  Avg: 97.9 F (36.6 C)  Min: 97.9 F (36.6 C)  Max: 97.9 F (36.6 C)  BP (!) 101/59 BP  Min: 99/75  Max: 105/57  HR 94 Pulse  Avg: 91.8  Min: 76  Max: 108  RR 17 Resp  Avg: 17  Min: 17  Max: 17  SaO2 100 % Not Delivered SpO2  Avg: 100 %  Min: 100 %  Max: 100 %       24 Hour I/O Current Shift I/O  Time Ins Outs No  intake/output data recorded. No intake/output data recorded.   Patient Vitals for the past 24 hrs:  BP Temp Temp src Pulse Resp SpO2 Height Weight  10/03/16 0247 (!) 101/59 - - 94 - - - -  10/03/16 0246 (!) 105/57 - - 89 - - - -  10/03/16 0243 (!) 102/52 - - 76 - - - -  10/03/16 0118 99/75 97.9 F (36.6 C) Oral (!) 108 17 100 % 5\' 4"  (1.626 m) 56.2 kg (124 lb)    Body mass index is 21.28 kg/m. General appearance: Well nourished, well developed female in no acute distress.  Neck:  Supple, normal appearance, and no thyromegaly  Cardiovascular: S1, S2 normal, no murmur, rub or gallop, regular rate and rhythm Respiratory:  Clear to auscultation bilateral. Normal respiratory effort Abdomen: soft, nttp, nd.  Neuro/Psych:  Normal mood and affect.  Skin:  Warm and dry.  Extremities: no clubbing, cyanosis, or edema.  Lymphatic:  No inguinal lymphadenopathy.   Pelvic exam: per Marlou Porch CNM Moderate amount of blood in the vault with some oozing after removal. Cervix, uterus nttp   Laboratory: Beta HCG: 74  Recent Labs Lab 10/03/16 0222 10/03/16 0457  WBC 8.0 9.1  HGB 9.6* 10.2*  HCT 28.2* 30.2*  PLT 187 231    Recent Labs Lab 10/03/16 0457  NA 136  K 3.7  CL 106  CO2 25  BUN 18  CREATININE 0.61  CALCIUM 8.7*  PROT 6.6  BILITOT 0.8  ALKPHOS 52  ALT 17  AST 21  GLUCOSE 149*    Recent Labs Lab 10/03/16 0222  ABORH B POS    Imaging:   CLINICAL DATA:  Initial evaluation for recent miscarriage, status post sciatic sac, still with vaginal bleeding. Concern for retained products of conception.  EXAM: OBSTETRIC <14 WK [place] AND TRANSVAGINAL OB US  TECHNIQUE: Both transabdominal and transvaginal ultrasound examinations were performed for complete evaluation of the gestation as well as the maternal uterus, adnexal regions, and pelvic cul-de-sac. Transvaginal technique was performed to assess early pregnancy.  COMPARISON:  None.  FINDINGS: Intrauterine  gestational sac: None.  Yolk sac:  N/A.  Embryo:  N/A.  Cardiac Activity: N/A.  Heart Rate: N/A.  Bpm  Subchorionic hemorrhage:  None visualized.  Maternal uterus/adnexae: Both the right and left ovaries well visualized  within the adnexa and are normal in appearance. No adnexal mass.  Maternal endometrium thickened up to approximately 17 mm with heterogeneous echotexture. Few scattered small cystic areas noted. Scattered areas of vascularity present. Findings concerning for retained products of conception.  IMPRESSION: Thickened heterogeneous endometrium as above with associated scattered areas of vascularity. Findings concerning for retained products of conception.   Electronically Signed   By: Jeannine Boga M.D.   On: 10/03/2016 05:17  Uterus appears 8-9cm in length, mid plane to anteverted  Assessment: Paige Chambers is a 36 y.o. [phone] (Patient's last menstrual period was 06/26/2016.) who presented to the MAU with complaints of VB; findings are consistent with possible rPOCs, anemia. Pt is stable.  Plan: D/w pt that u/s shows possible concern for rPOCs, but given her anemia and bleeding, I'd recommend doing a suction D&C to avoid further bleeding. R/b/a d/w her and husband they are amenable to suction D&C. OR aware. Patient last ate before midnight   Durene Romans. MD Attending Center for [facility])

## 2016-10-03 NOTE — Anesthesia Procedure Notes (Signed)
Procedure Name: LMA Insertion Date/Time: 10/03/2016 6:57 AM Performed by: Farhiya Rosten, Sheron Nightingale Pre-anesthesia Checklist: Patient identified, Emergency Drugs available, Suction available, Patient being monitored and Timeout performed Patient Re-evaluated:Patient Re-evaluated prior to induction Oxygen Delivery Method: Circle system utilized Preoxygenation: Pre-oxygenation with 100% oxygen Induction Type: IV induction Number of attempts: 1 ETT to lip (cm): at lip. Dental Injury: Teeth and Oropharynx as per pre-operative assessment

## 2016-10-03 NOTE — Op Note (Addendum)
Operative Note   10/03/2016  PRE-OP DIAGNOSIS: vaginal bleeding, concern for retained products of conception after medical management of missed abortion   POST-OP DIAGNOSIS: Same  SURGEON: Surgeon(s) and Role:    Aletha Halim, MD - Primary  ASSISTANT: none  PROCEDURE:  Suction dilation and curettage  ANESTHESIA: Monitor Anesthesia Care and paracervical block  ESTIMATED BLOOD LOSS: 50mL  DRAINS: I/O cath 66mL UOP  TOTAL IV FLUIDS: per anesthesia note  SPECIMENS: products of conception to pathology  VTE PROPHYLAXIS: SCDs to the bilateral lower extremities  ANTIBIOTICS: Doxycycline 100mg  IV x 1 pre op  COMPLICATIONS: none  DISPOSITION: PACU - hemodynamically stable.  CONDITION: stable  BLOOD TYPE: B POS. Rhogam given:not applicable  FINDINGS: Exam under anesthesia revealed 10 week sized uterus with no masses and bilateral adnexa without masses or fullness. Necrotic appearing products of conception were seen and gritty texture in all four quadrants at the end of the procedure. Patient sounded to 8.5cm  PROCEDURE IN DETAIL:  After informed consent was obtained, the patient was taken to the operating room where anesthesia was obtained without difficulty. The patient was positioned in the dorsal lithotomy position in [place]. The patient was examined under anesthesia, with the above noted findings.  The bi-valved speculum was placed inside the patient's vagina, and the the anterior lip of the cervix was seen and grasped with the tenaculum.  A paracervical block was achieved with 79mL of 1% lidocaine and then the cervix was easily dilated to a 19 French-Pratt dilator after sounding to 8.5cm.  The suction was then calibrated to 76mmHg and connected to the number 10 cannula, which was then introduced with the above noted findings. A gentle curettage was done at the end and yield no products of conception.   The suction was then done one more time to remove any remaining  curettage material.   Excellent hemostasis was noted, and all instruments were removed, with excellent hemostasis noted throughout.  She was then taken out of dorsal lithotomy. The patient tolerated the procedure well.  Sponge, lap and instrument counts were correct x2.  The patient was taken to recovery room in excellent condition.  Durene Romans MD Attending Center for [employer])

## 2016-10-03 NOTE — MAU Provider Note (Signed)
History     CSN: [id number]  Arrival date and time: 10/03/16 0102   First Provider Initiated Contact with Patient 10/03/16 0134      Chief Complaint  Patient presents with  . Vaginal Bleeding   HPI Paige Chambers is a 36 y.o. [id number] 3 weeks S/P Dx MAB on 7/12, Tx w/ Cytotec who presents with vaginal bleeding. She states the bleeding started at 2230 and is much heavier than her normal period. She has used 3 pads and two Depends since then. She reports intermittent abdominal pain that she rates a 1/10 and has not needed any medication for the pain. She reports she took cytotec for a miscarriage on 7/13, had heavy bleeding and an ultrasound on 7/20 that she states showed she had passed everything. She has continued to have spotting since then until tonight when the bleeding got heavier. Has been passing clots, but not tissue. She [facility].   Associated Sx: Pos for rapid heart rate, mild SOB. Neg for fever, chills, dizziness, syncope.  OB History    Gravida Para Term Preterm AB Living   5 3 2 1 1 3    SAB TAB Ectopic Multiple Live Births   1     0 3      Past Medical History:  Diagnosis Date  . Gestational diabetes-Early Dx w/ G5   . Hx of varicella   . Liver hemangioma     Past Surgical History:  Procedure Laterality Date  . CESAREAN SECTION N/A 04/13/2015   Procedure: CESAREAN SECTION;  Surgeon: Marylynn Pearson, MD;  Location: [place] [facility];  Service: Obstetrics;  Laterality: N/A;  . WISDOM TOOTH EXTRACTION      Family History  Problem Relation Age of Onset  . Hypertension Father   . Heart disease Father   . Hypertension Mother     Social History  Substance Use Topics  . Smoking status: Never Smoker  . Smokeless tobacco: Never Used  . Alcohol use Yes     Comment: occasional    Allergies:  Allergies  Allergen Reactions  . Iohexol Hives and Nausea And Vomiting         Prescriptions Prior to Admission  Medication Sig Dispense Refill  Last Dose  . ibuprofen (ADVIL,MOTRIN) 600 MG tablet Take 1 tablet (600 mg total) by mouth every 6 (six) hours. 30 tablet 1 More than a month at Unknown time  . oxyCODONE-acetaminophen (PERCOCET/ROXICET) 5-325 MG tablet Take 1-2 tablets by mouth every 4 (four) hours as needed for moderate pain. 30 tablet 0 More than a month at Unknown time  . Prenatal Vit-Fe Fumarate-FA (PRENATAL MULTIVITAMIN) TABS tablet Take 1 tablet by mouth daily at 12 noon.   More than a month at Unknown time    Review of Systems  Constitutional: Negative.  Negative for chills and fever.  HENT: Negative.   Respiratory: Negative.  Negative for shortness of breath.   Cardiovascular: Negative.  Negative for chest pain.       Rapid heartrate  Gastrointestinal: Positive for abdominal pain. Negative for constipation, diarrhea and vomiting.  Genitourinary: Positive for vaginal bleeding. Negative for dysuria and vaginal discharge.  Neurological: Negative.  Negative for dizziness and headaches.  Psychiatric/Behavioral: Negative.    Physical Exam   Blood pressure 99/75, pulse (!) 108, temperature 97.9 F (36.6 C), temperature source Oral, resp. rate 17, height 5\' 4"  (1.626 m), weight 124 lb (56.2 kg), last menstrual period 06/26/2016, SpO2 100 %, unknown if currently  breastfeeding.  Physical Exam  Nursing note and vitals reviewed. Constitutional: She is oriented to person, place, and time. She appears well-developed and well-nourished. No distress.  Cardiovascular: Normal rate, regular rhythm and normal heart sounds.   Respiratory: Effort normal and breath sounds normal.  GI: Soft. There is no tenderness.  Genitourinary: Uterus is not enlarged. Cervix exhibits no motion tenderness, no discharge and no friability. Right adnexum displays no tenderness. Left adnexum displays no tenderness. There is bleeding in the vagina. No tenderness in the vagina.  Genitourinary Comments: On speculum exam moderate amount of blood and clots in  vault. 3 x 6 cm fragment of tissue removed from os. Tearing sensation felt. Doubt all tissue was removed. Soaked 15 Fox swabs and moderate bleeding continued after removal of tissue.   Neurological: She is alert and oriented to person, place, and time.  Skin: Skin is warm and dry. No pallor.  Psychiatric: She has a normal mood and affect.    MAU Course  Procedures Results for orders placed or performed during the hospital encounter of 10/03/16 (from the past 24 hour(s))  Urinalysis, Routine w reflex microscopic     Status: Abnormal   Collection Time: 10/03/16  1:07 AM  Result Value Ref Range   Color, Urine AMBER (A) YELLOW   APPearance HAZY (A) CLEAR   Specific Gravity, Urine 1.032 (H) 1.005 - 1.030   pH 5.0 5.0 - 8.0   Glucose, UA NEGATIVE NEGATIVE mg/dL   Hgb urine dipstick LARGE (A) NEGATIVE   Bilirubin Urine NEGATIVE NEGATIVE   Ketones, ur 5 (A) NEGATIVE mg/dL   Protein, ur 100 (A) NEGATIVE mg/dL   Nitrite NEGATIVE NEGATIVE   Leukocytes, UA NEGATIVE NEGATIVE   RBC / HPF TOO NUMEROUS TO COUNT 0 - 5 RBC/hpf   WBC, UA 0-5 0 - 5 WBC/hpf   Bacteria, UA NONE SEEN NONE SEEN   Squamous Epithelial / LPF NONE SEEN NONE SEEN   Mucous PRESENT   Pregnancy, urine POC     Status: Abnormal   Collection Time: 10/03/16  1:36 AM  Result Value Ref Range   Preg Test, Ur POSITIVE (A) NEGATIVE  CBC     Status: Abnormal   Collection Time: 10/03/16  2:22 AM  Result Value Ref Range   WBC 8.0 4.0 - 10.5 K/uL   RBC 3.36 (L) 3.87 - 5.11 MIL/uL   Hemoglobin 9.6 (L) 12.0 - 15.0 g/dL   HCT 28.2 (L) 36.0 - 46.0 %   MCV 83.9 78.0 - 100.0 fL   MCH 28.6 26.0 - 34.0 pg   MCHC 34.0 30.0 - 36.0 g/dL   RDW 12.5 11.5 - 15.5 %   Platelets 187 150 - 400 K/uL  hCG, quantitative, pregnancy     Status: Abnormal   Collection Time: 10/03/16  2:22 AM  Result Value Ref Range   hCG, Beta Chain, Quant, S 74 (H) <5 mIU/mL  Type and screen [place]     Status: None   Collection Time: 10/03/16   2:22 AM  Result Value Ref Range   ABO/RH(D) B POS    Antibody Screen NEG    Sample Expiration 10/06/2016   Comprehensive metabolic panel     Status: Abnormal   Collection Time: 10/03/16  4:57 AM  Result Value Ref Range   Sodium 136 135 - 145 mmol/L   Potassium 3.7 3.5 - 5.1 mmol/L   Chloride 106 101 - 111 mmol/L   CO2 25 22 - 32 mmol/L  Glucose, Bld 149 (H) 65 - 99 mg/dL   BUN 18 6 - 20 mg/dL   Creatinine, Ser 0.61 0.44 - 1.00 mg/dL   Calcium 8.7 (L) 8.9 - 10.3 mg/dL   Total Protein 6.6 6.5 - 8.1 g/dL   Albumin 3.7 3.5 - 5.0 g/dL   AST 21 15 - 41 U/L   ALT 17 14 - 54 U/L   Alkaline Phosphatase 52 38 - 126 U/L   Total Bilirubin 0.8 0.3 - 1.2 mg/dL   GFR calc non Af Amer >60 >60 mL/min   GFR calc Af Amer >60 >60 mL/min   Anion gap 5 5 - 15  CBC     Status: Abnormal   Collection Time: 10/03/16  4:57 AM  Result Value Ref Range   WBC 9.1 4.0 - 10.5 K/uL   RBC 3.63 (L) 3.87 - 5.11 MIL/uL   Hemoglobin 10.2 (L) 12.0 - 15.0 g/dL   HCT 30.2 (L) 36.0 - 46.0 %   MCV 83.2 78.0 - 100.0 fL   MCH 28.1 26.0 - 34.0 pg   MCHC 33.8 30.0 - 36.0 g/dL   RDW 12.4 11.5 - 15.5 %   Platelets 231 150 - 400 K/uL   MAU Course UA, UPT CBC, quant, type and screen LR bolus EKG NPO  Called [employer] and Card Fellow x 2 w/ no answer. Discussed Hx, labs, exam w/ Dr. Ilda Basset.New orders: [place], repeat CBC.   Informed Dr. Ilda Basset of [place] concerning for retained POC's. Will come discuss management options w/ pt and spouse.   Pt reports another large gush of blood. Also felt like she light pass out when in [place]. Feels better now in semi-Fowlers. Hgb stable.   Dr. Ilda Basset at [facility] recommending D&E. Pt consents.   MDM - Probable incomplete AB w/ ongoing moderate bleeding. Pt symptomatic evan after fluid bolus. Not a good candidate for expectant management or repeat Cytotec.   Assessment   1. Abortion, spontaneous incomplete with hemorrhage    Plan Prep for OR per Dr. Hollie Salk, [place],  [place] 10/03/2016 5:51 AM

## 2016-10-03 NOTE — Anesthesia Preprocedure Evaluation (Addendum)
Anesthesia Evaluation  Patient identified by MRN, date of birth, ID band Patient awake    Reviewed: Allergy & Precautions, H&P , NPO status , Patient's Chart, lab work & pertinent test results  Airway Mallampati: I  TM Distance: >3 FB Neck ROM: full    Dental no notable dental hx. (+) Teeth Intact   Pulmonary neg pulmonary ROS,    Pulmonary exam normal breath sounds clear to auscultation       Cardiovascular negative cardio ROS Normal cardiovascular exam     Neuro/Psych negative neurological ROS  negative psych ROS   GI/Hepatic negative GI ROS, Neg liver ROS,   Endo/Other  diabetes  Renal/GU negative Renal ROS     Musculoskeletal negative musculoskeletal ROS (+)   Abdominal Normal abdominal exam  (+)   Peds  Hematology  (+) Blood dyscrasia, anemia ,   Anesthesia Other Findings   Reproductive/Obstetrics (+) Pregnancy                            Anesthesia Physical Anesthesia Plan  ASA: II  Anesthesia Plan: General   Post-op Pain Management:    Induction:   PONV Risk Score and Plan: 3 and Ondansetron, Dexamethasone, Midazolam and Propofol infusion  Airway Management Planned: LMA  Additional Equipment:   Intra-op Plan:   Post-operative Plan:   Informed Consent: I have reviewed the patients History and Physical, chart, labs and discussed the procedure including the risks, benefits and alternatives for the proposed anesthesia with the patient or authorized representative who has indicated his/her understanding and acceptance.     Plan Discussed with: CRNA and Surgeon  Anesthesia Plan Comments:         Anesthesia Quick Evaluation

## 2016-10-03 NOTE — MAU Note (Signed)
Pt states that she was given Cytotec for a miscarriage on 09/12/2016. States she had an u/s on 09/19/2016 and the u/s showed that she had passed everything. Reports having some brown discharge until tonight when she started having heavy, bright red vaginal bleeding. Pt reports changing pad multiple times and passing large clots. Pt states that she feels like her heart is racing and feels SOB. States she talked to on call dr. and was told to come in. Pt reports mild lower abdominal cramping. LMP: 06/26/2016

## 2016-10-03 NOTE — Discharge Instructions (Signed)
° °  We will discuss your surgery once again in detail at your post-op visit in two to four weeks. If you haven't already done so, please call to make your appointment as soon as possible.  Dilation and Curettage or Vacuum Curettage, Care After These instructions give you information on caring for yourself after your procedure. Your doctor may also give you more specific instructions. Call your doctor if you have any problems or questions after your procedure. HOME CARE Do not drive for 24 hours. Wait 1 week before doing any activities that wear you out. Do not stand for a long time. Limit stair climbing to once or twice a day. Rest often. Continue with your usual diet. Drink enough fluids to keep your pee (urine) clear or pale yellow. If you have a hard time pooping (constipation), you may: Take a medicine to help you go poop (laxative) as told by your doctor. Eat more fruit and bran. Drink more fluids. Take showers, not baths, for as long as told by your doctor. Do not swim or use a hot tub until your doctor says it is okay. Have someone with you for 1day after the procedure. Do not douche, use tampons, or have sex (intercourse) until seen by your doctor Only take medicines as told by your doctor. Do not take aspirin. It can cause bleeding. Keep all doctor visits. GET HELP IF: You have cramps or pain not helped by medicine. You have new pain in the belly (abdomen). You have a bad smelling fluid coming from your vagina. You have a rash. You have problems with any medicine. GET HELP RIGHT AWAY IF:  You start to bleed more than a regular period. You have a fever. You have chest pain. You have trouble breathing. You feel dizzy or feel like passing out (fainting). You pass out. You have pain in the tops of your shoulders. You have vaginal bleeding with or without clumps of blood (blood clots). MAKE SURE YOU: Understand these instructions. Will watch your condition. Will get help  right away if you are not doing well or get worse. Document Released: 11/27/2007 Document Revised: 02/22/2013 Document Reviewed: 09/16/2012 ExitCare Patient Information 2015 [employer]. This information is not intended to replace advice given to you by your health care provider. Make sure you discuss any questions you have with your health care provider.   

## 2016-10-13 NOTE — Anesthesia Postprocedure Evaluation (Signed)
Anesthesia Post Note  Patient: Paige Chambers  Procedure(s) Performed: Procedure(s) (LRB): DILATATION AND CURETTAGE (N/A)     Patient location during evaluation: PACU Anesthesia Type: General Level of consciousness: awake Pain management: pain level controlled Vital Signs Assessment: post-procedure vital signs reviewed and stable Respiratory status: spontaneous breathing Cardiovascular status: stable Postop Assessment: no signs of nausea or vomiting Anesthetic complications: no    Last Vitals:  Vitals:   10/03/16 0845 10/03/16 0915  BP: (!) 93/53 (!) 97/55  Pulse: 69 87  Resp: 16 16  Temp: 36.7 C 36.7 C  SpO2: 100% 100%    Last Pain:  Vitals:   10/07/16 1439  TempSrc:   PainSc: 0-No pain   Pain Goal:                 Chere Babson JR,JOHN Boomer Winders

## 2016-10-14 ENCOUNTER — Encounter (HOSPITAL_COMMUNITY): Payer: Self-pay | Admitting: Obstetrics and Gynecology

## 2016-10-16 ENCOUNTER — Encounter: Payer: Self-pay | Admitting: Obstetrics and Gynecology

## 2016-10-16 ENCOUNTER — Ambulatory Visit (INDEPENDENT_AMBULATORY_CARE_PROVIDER_SITE_OTHER): Payer: PRIVATE HEALTH INSURANCE | Admitting: Obstetrics and Gynecology

## 2016-10-16 VITALS — BP 109/59 | HR 72

## 2016-10-16 DIAGNOSIS — Z124 Encounter for screening for malignant neoplasm of cervix: Secondary | ICD-10-CM

## 2016-10-16 DIAGNOSIS — Z09 Encounter for follow-up examination after completed treatment for conditions other than malignant neoplasm: Secondary | ICD-10-CM

## 2016-10-16 DIAGNOSIS — Z1151 Encounter for screening for human papillomavirus (HPV): Secondary | ICD-10-CM

## 2016-10-16 DIAGNOSIS — Z9889 Other specified postprocedural states: Secondary | ICD-10-CM

## 2016-10-16 NOTE — Progress Notes (Signed)
Post Op Visit 10/16/2016  CC: regular post op visit  Subjective:   She is s/p 8/3 suction d&c due to heavy bleeding and concerned for rPOCs. She had an uncomplicated procedure and was discharged to home from the pacu. Final path did show villi  Just occasional spotting now, no menses yet.  Objective:    BP (!) 109/59   Pulse 72   Breastfeeding? No   NAD Abdomen: soft, nttp, nd EGBUS normal, vaginal vault normal and no bleeding or discharge, cervix nttp  Assessment:    Doing well postoperatively.    Plan:  D/w her that she is doing well and can come off pelvic rest. Patient does have some questions: 1) when can they try again: I recommend that she wait until she has a period, which she was told to expect sometime within 8wks of having the procedure, and until her early GDM diagnosis is fully followed up on. She has a PCP visit next week to go over this, but I told her that it's favorable to wait until 6wks from the d&c before doing a post pregnancy GTT 2) mfm recommendations: I told her that they only issue they may have an opinion on is her early PTB. The patient states it was due to an abruption but the progress notes state it was due to 30wk pprom, breech and in PTL. Nothing in the op note indicates abruption and the placenta path was negative. She definitely will be recommended 17p but I wouldn't recommend anything other than that. Even if there was an abruption, most probably wouldn't recommend a thrombophilia work up given her other normal deliveries. Her 6/26 WF a1c was 5.7.  I told her that if she'd like to see [facility] at [place] (she didn't have a good experience with her [facility] MFM doctors) that we can set that up and see what they think; she declines this at this time.  I recommended that she continue the PNV for another two weeks and then switch to FA 1mg  thereafter.  Pap smear updated today.   Durene Romans MD Attending Center for [employer] (Faculty  Practice) 10/16/2016 Time: [phone]

## 2016-10-18 LAB — CYTOLOGY - PAP
DIAGNOSIS: NEGATIVE
HPV: NOT DETECTED

## 2017-01-24 IMAGING — US US MFM FETAL BPP W/O NON-STRESS
1 series · 13 of 25 positions shown · non-contrast
Comparison: none

[Series 1: us mfm fetal bpp w/o non-stress · 0.23mm/px · 25 acquisitions, 13 frames shown]
[im 1/25]
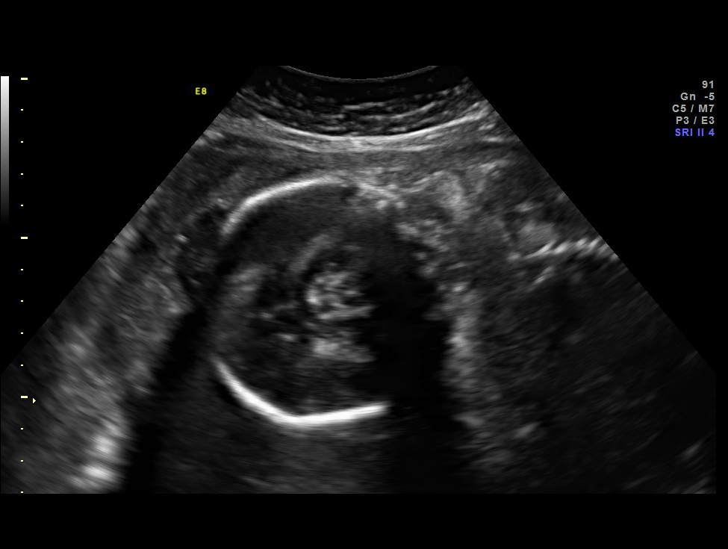
[im 3/25]
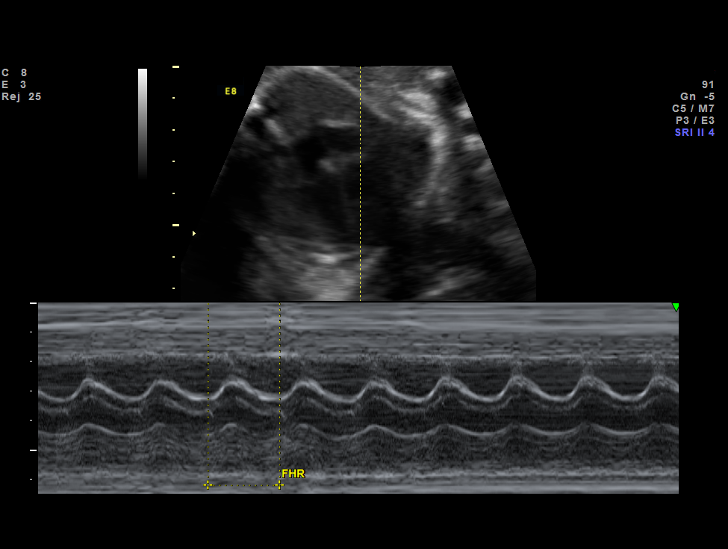
[im 5/25]
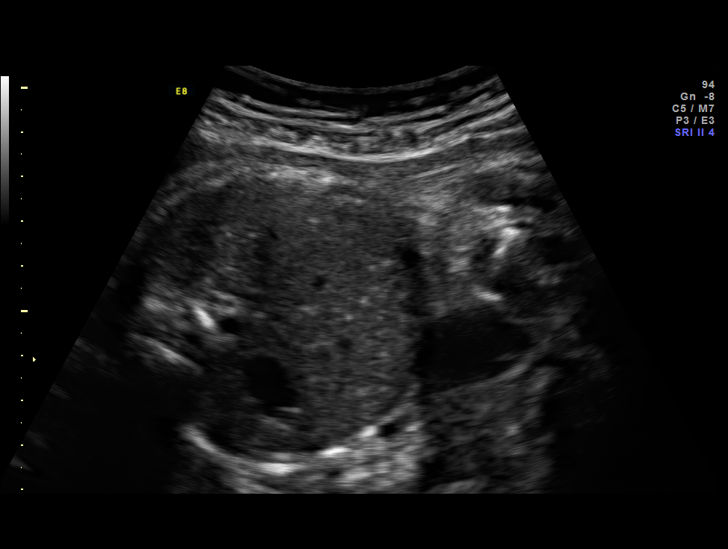
[im 7/25]
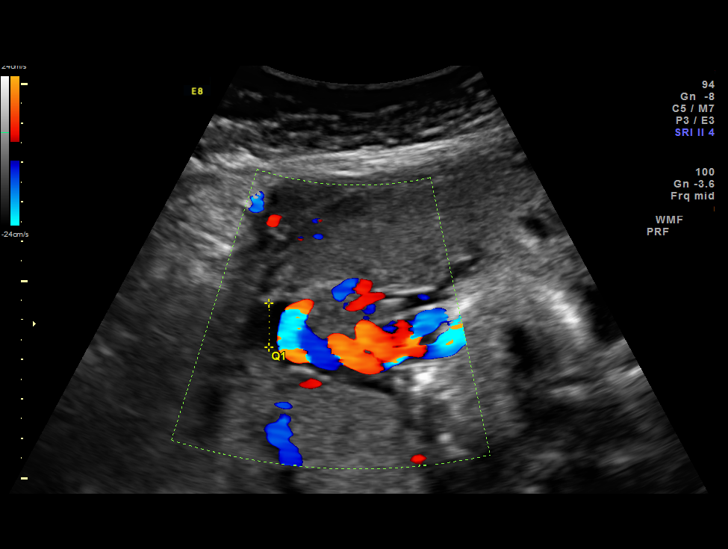
[im 9/25]
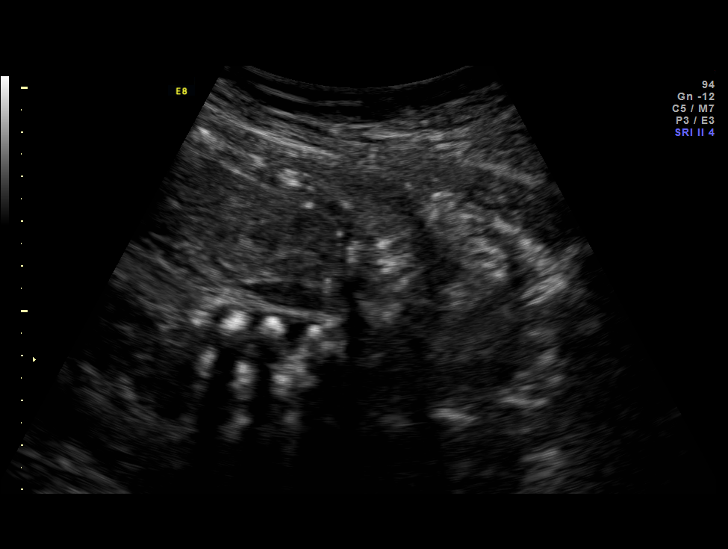
[im 11/25]
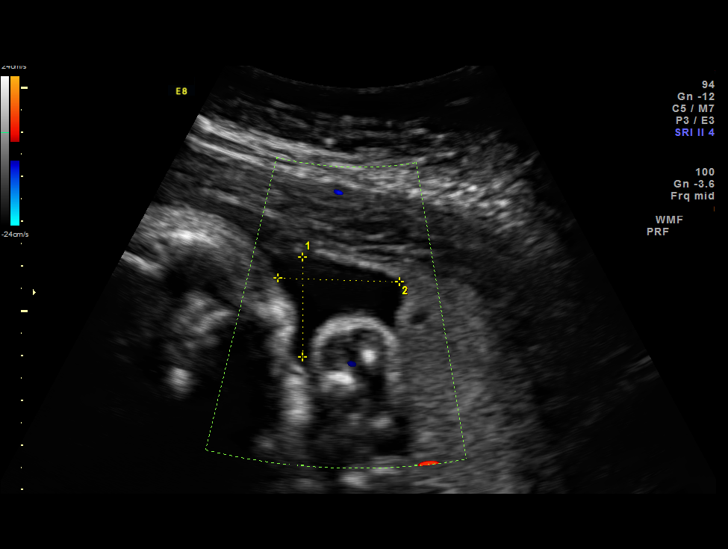
[im 13/25]
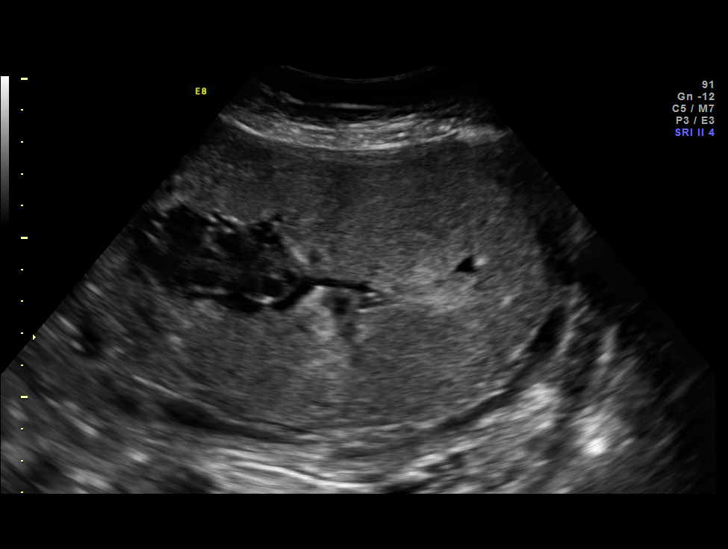
[im 15/25]
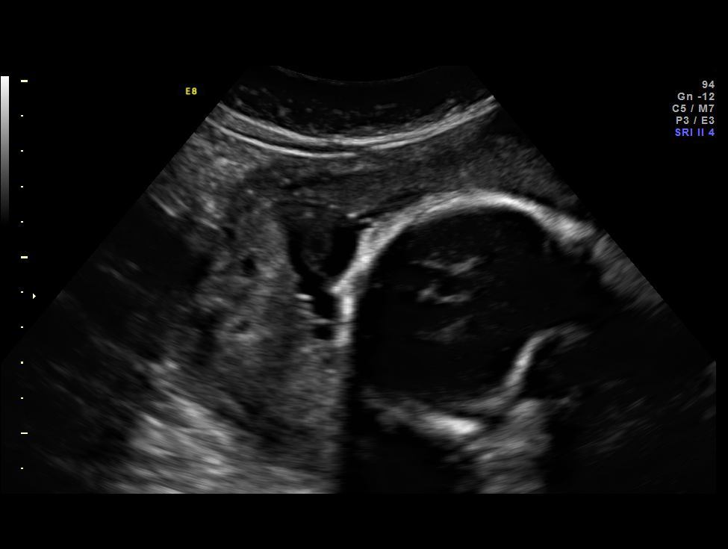
[im 17/25]
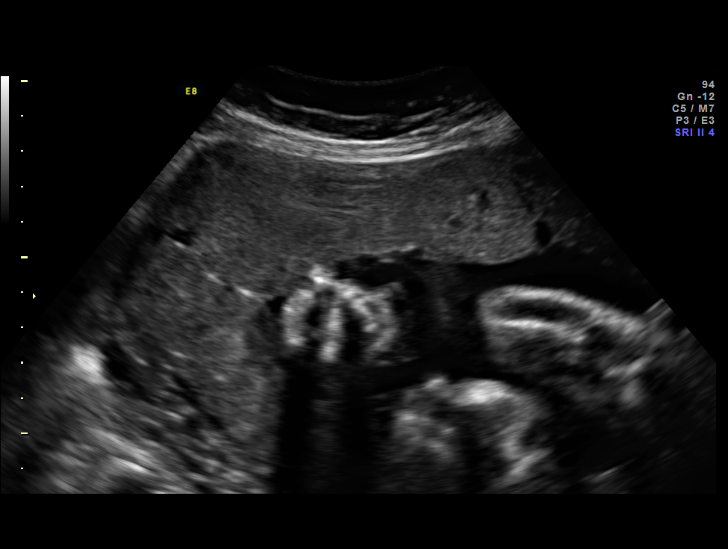
[im 19/25]
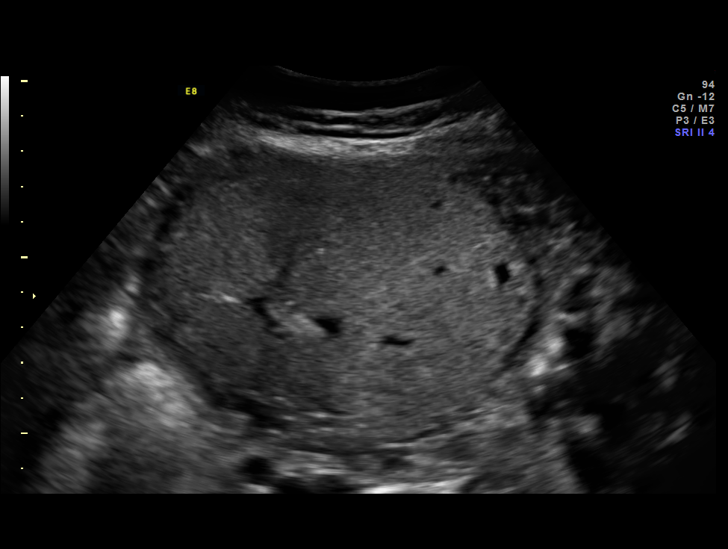
[im 21/25]
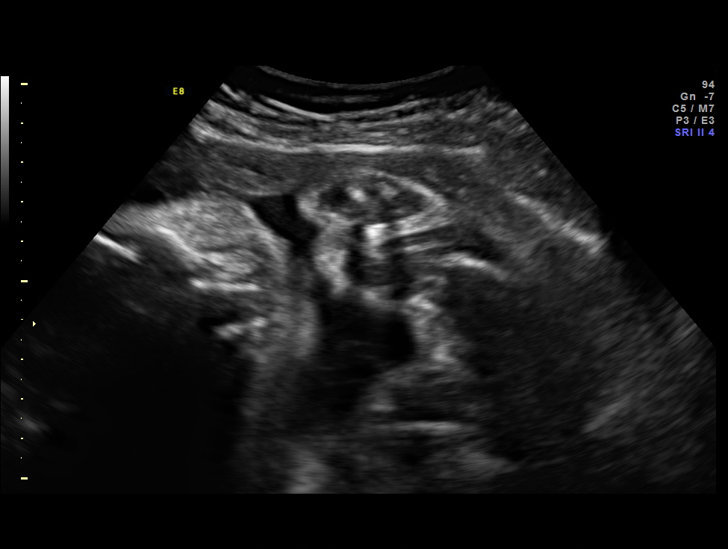
[im 23/25]
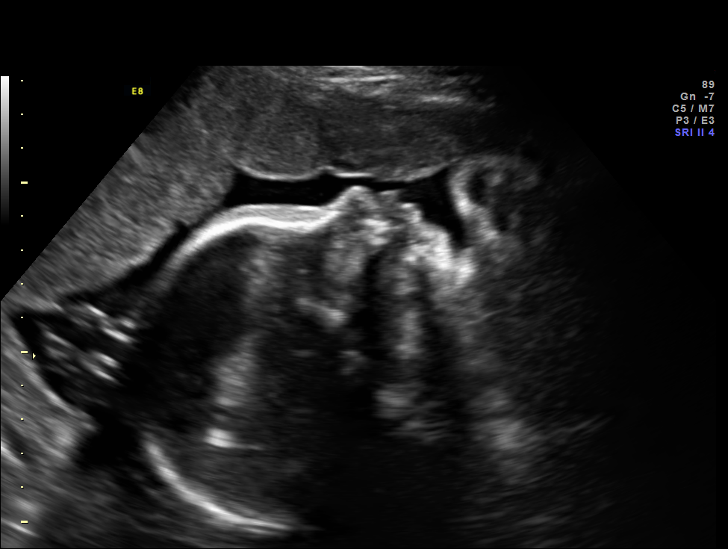
[im 25/25]
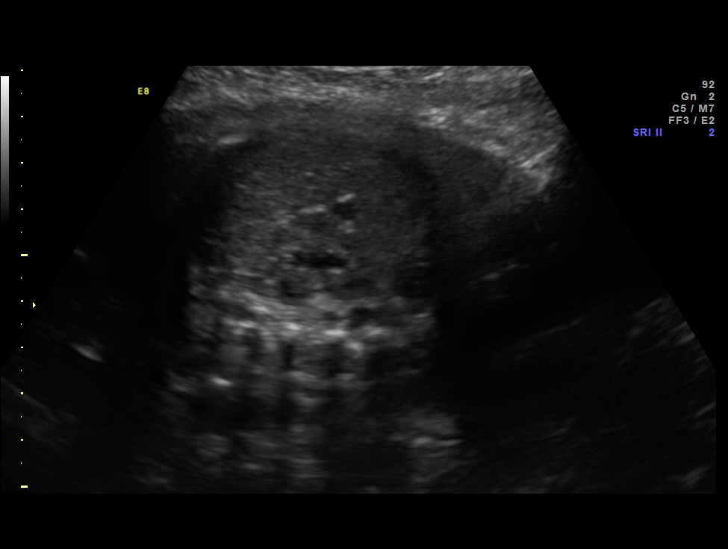

[13 of 25 positions shown; findings below may reference images not displayed]

Terrace
Attending:        Novak Ivana Sonigo        Secondary Phy.:   ANTE Nursing-
Antenatal [REDACTED]9

1  SHANTELL JAVIER               [id number]      [id number]     [id number]
Indications

30 weeks gestation of pregnancy
Premature rupture of membranes - leaking
fluid
Advanced maternal age multigravida 34,
third trimester - low risk NIPS
Poor obstetric history: Previous gestational
diabetes (x 2)
Uterine abnormality during pregnancy
(synechiae)
BMZ [DATE], [DATE]
OB History

Height:       5'4"    Weight:   148.8      BMI:
Gravidity:    4         Term:   2        Prem:   0        SAB:   1
TOP:          0       Ectopic:  0        Living: 2
Fetal Evaluation

Num Of Fetuses:     1
Fetal Heart         141
Rate(bpm):
Cardiac Activity:   Observed
Presentation:       Frank breech
Placenta:           Fundal RT with post acc lobe, above os
P. Cord Insertion:  suspected Velamentous ins

Amniotic Fluid
AFI FV:      Subjectively decreased
AFI Sum:     4.52    cm      < 3  %Tile     Larg Pckt:     2.4  cm
RUQ:   1.12    cm   LUQ:    1      cm    LLQ:   2.4     cm
Biophysical Evaluation

Amniotic F.V:   Pocket => 2 cm two         F. Tone:        Observed
planes
F. Movement:    Observed                   Score:          [DATE]
F. Breathing:   Observed
Gestational Age

LMP:           30w 4d        Date:  09/09/14                 EDD:   06/16/15
Best:          30w 4d     Det. By:  LMP  (09/09/14)          EDD:   06/16/15
Impression

Single IUP at 30w 4d
PROM
Frank Breech presentation
BPP [DATE]
Suspected velamentous placental cord insertion
Oligohydramnios noted (AFI 4.5 cm)
Recommendations

Ultrasound for growth in 1-2 weeks

## 2017-06-01 HISTORY — PX: OTHER SURGICAL HISTORY: SHX169

## 2018-01-27 ENCOUNTER — Encounter (HOSPITAL_COMMUNITY): Payer: Self-pay | Admitting: *Deleted

## 2018-01-27 ENCOUNTER — Other Ambulatory Visit: Payer: Self-pay

## 2018-01-27 NOTE — H&P (Addendum)
Paige Chambers is an 37 y.o. female presents for surgical mngt of missed Ab    Past Medical History:  Diagnosis Date  . Gestational diabetes   . Hx of varicella   . Liver hemangioma   . Pre-diabetes   . Preterm premature rupture of membranes (PPROM) with unknown onset of labor 03/27/2015  . SVD (spontaneous vaginal delivery)    x 2    Past Surgical History:  Procedure Laterality Date  . CESAREAN SECTION N/A 04/13/2015   Procedure: CESAREAN SECTION;  Surgeon: Marylynn Pearson, MD;  Location: [facility];  Service: Obstetrics;  Laterality: N/A;  . D & C Hysteroscopy  06/2017  . DILATION AND CURETTAGE OF UTERUS N/A 10/03/2016   Procedure: DILATATION AND CURETTAGE;  Surgeon: Aletha Halim, MD;  Location: [facility];  Service: Gynecology;  Laterality: N/A;  . WISDOM TOOTH EXTRACTION      Family History  Problem Relation Age of Onset  . Hypertension Father   . Heart disease Father   . Hypertension Mother     Social History:  reports that she quit smoking about 6 years ago. Her smoking use included cigarettes. She has a 5.00 pack-year smoking history. She has never used smokeless tobacco. She reports that she drinks alcohol. She reports that she does not use drugs.  Allergies:  Allergies  Allergen Reactions  . Iohexol Hives and Nausea And Vomiting         No medications prior to admission.    ROS  Height 5\' 4"  (1.626 m), weight 59 kg. Physical Exam  Gen - NAD Abd - soft, NTND Ext - NT  Blood type B+  Assessment/Plan:  Recurrent pregnancy loss, missed Ab D&E with genetic studies R/b/a discussed, questions answered, informed consent Marylynn Pearson 01/27/2018, 1:23 PM

## 2018-02-01 MED ORDER — SODIUM CHLORIDE 0.9 % IV SOLN: 2.0000 g | INTRAVENOUS | Status: AC

## 2018-02-02 ENCOUNTER — Ambulatory Visit (HOSPITAL_COMMUNITY): Payer: PRIVATE HEALTH INSURANCE | Admitting: Certified Registered Nurse Anesthetist

## 2018-02-02 ENCOUNTER — Ambulatory Visit (HOSPITAL_COMMUNITY)
Admission: RE | Admit: 2018-02-02 | Discharge: 2018-02-02 | Disposition: A | Discharge status: Home / Self Care | Payer: PRIVATE HEALTH INSURANCE | Source: Ambulatory Visit | Attending: Obstetrics and Gynecology | Admitting: Obstetrics and Gynecology

## 2018-02-02 ENCOUNTER — Encounter (HOSPITAL_COMMUNITY): Payer: Self-pay | Admitting: Certified Registered Nurse Anesthetist

## 2018-02-02 ENCOUNTER — Encounter (HOSPITAL_COMMUNITY)
Admission: RE | Disposition: A | Discharge status: Home / Self Care | Payer: Self-pay | Source: Ambulatory Visit | Attending: Obstetrics and Gynecology

## 2018-02-02 ENCOUNTER — Other Ambulatory Visit: Payer: Self-pay

## 2018-02-02 DIAGNOSIS — O021 Missed abortion: Secondary | ICD-10-CM | POA: Insufficient documentation

## 2018-02-02 DIAGNOSIS — Z87891 Personal history of nicotine dependence: Secondary | ICD-10-CM | POA: Insufficient documentation

## 2018-02-02 HISTORY — DX: Prediabetes: R73.03

## 2018-02-02 HISTORY — PX: DILATION AND EVACUATION: SHX1459

## 2018-02-02 LAB — CBC
HCT: 36.3 % (ref 36.0–46.0)
HEMOGLOBIN: 12.1 g/dL (ref 12.0–15.0)
MCH: 28.6 pg (ref 26.0–34.0)
MCHC: 33.3 g/dL (ref 30.0–36.0)
MCV: 85.8 fL (ref 80.0–100.0)
NRBC: 0 % (ref 0.0–0.2)
Platelets: 233 10*3/uL (ref 150–400)
RBC: 4.23 MIL/uL (ref 3.87–5.11)
RDW: 12.6 % (ref 11.5–15.5)
WBC: 6.6 10*3/uL (ref 4.0–10.5)

## 2018-02-02 LAB — TYPE AND SCREEN
ABO/RH(D): B POS
ANTIBODY SCREEN: NEGATIVE

## 2018-02-02 SURGERY — DILATION AND EVACUATION, UTERUS
Anesthesia: General

## 2018-02-02 MED ORDER — OXYCODONE HCL 5 MG PO TABS: 5.0000 mg | ORAL_TABLET | Freq: Once | ORAL | Status: DC | PRN

## 2018-02-02 MED ORDER — KETOROLAC TROMETHAMINE 30 MG/ML IJ SOLN
INTRAMUSCULAR | Status: DC | PRN
  Administered 2018-02-02: 30 mg via INTRAVENOUS

## 2018-02-02 MED ORDER — 0.9 % SODIUM CHLORIDE (POUR BTL) OPTIME
TOPICAL | Status: DC | PRN
  Administered 2018-02-02: 1000 mL

## 2018-02-02 MED ORDER — LACTATED RINGERS IV SOLN
INTRAVENOUS | Status: DC
  Administered 2018-02-02: 12:00:00 via INTRAVENOUS

## 2018-02-02 MED ORDER — LACTATED RINGERS IV SOLN: INTRAVENOUS | Status: DC

## 2018-02-02 MED ORDER — DEXAMETHASONE SODIUM PHOSPHATE 4 MG/ML IJ SOLN
INTRAMUSCULAR | Status: AC
  Filled 2018-02-02: qty 1

## 2018-02-02 MED ORDER — OXYCODONE HCL 5 MG/5ML PO SOLN: 5.0000 mg | Freq: Once | ORAL | Status: DC | PRN

## 2018-02-02 MED ORDER — KETOROLAC TROMETHAMINE 30 MG/ML IJ SOLN: 30.0000 mg | Freq: Once | INTRAMUSCULAR | Status: DC | PRN

## 2018-02-02 MED ORDER — SCOPOLAMINE 1 MG/3DAYS TD PT72
1.0000 | MEDICATED_PATCH | Freq: Once | TRANSDERMAL | Status: DC
  Administered 2018-02-02: 1.5 mg via TRANSDERMAL

## 2018-02-02 MED ORDER — KETOROLAC TROMETHAMINE 30 MG/ML IJ SOLN
INTRAMUSCULAR | Status: AC
  Filled 2018-02-02: qty 1

## 2018-02-02 MED ORDER — ONDANSETRON HCL 4 MG/2ML IJ SOLN
INTRAMUSCULAR | Status: AC
  Filled 2018-02-02: qty 2

## 2018-02-02 MED ORDER — ONDANSETRON HCL 4 MG/2ML IJ SOLN: 4.0000 mg | Freq: Once | INTRAMUSCULAR | Status: DC | PRN

## 2018-02-02 MED ORDER — FENTANYL CITRATE (PF) 100 MCG/2ML IJ SOLN
INTRAMUSCULAR | Status: AC
  Filled 2018-02-02: qty 2

## 2018-02-02 MED ORDER — CHLOROPROCAINE HCL 1 % IJ SOLN
INTRAMUSCULAR | Status: AC
  Filled 2018-02-02: qty 30

## 2018-02-02 MED ORDER — SODIUM CHLORIDE 0.9 % IV SOLN
INTRAVENOUS | Status: AC
  Filled 2018-02-02: qty 2

## 2018-02-02 MED ORDER — PROPOFOL 10 MG/ML IV BOLUS
INTRAVENOUS | Status: AC
  Filled 2018-02-02: qty 20

## 2018-02-02 MED ORDER — MEPERIDINE HCL 25 MG/ML IJ SOLN: 6.2500 mg | INTRAMUSCULAR | Status: DC | PRN

## 2018-02-02 MED ORDER — ONDANSETRON HCL 4 MG/2ML IJ SOLN
INTRAMUSCULAR | Status: DC | PRN
  Administered 2018-02-02: 4 mg via INTRAVENOUS

## 2018-02-02 MED ORDER — FENTANYL CITRATE (PF) 100 MCG/2ML IJ SOLN
INTRAMUSCULAR | Status: DC | PRN
  Administered 2018-02-02 (×2): 50 ug via INTRAVENOUS

## 2018-02-02 MED ORDER — LIDOCAINE HCL (CARDIAC) PF 100 MG/5ML IV SOSY
PREFILLED_SYRINGE | INTRAVENOUS | Status: DC | PRN
  Administered 2018-02-02: 100 mg via INTRAVENOUS

## 2018-02-02 MED ORDER — CHLOROPROCAINE HCL 1 % IJ SOLN
INTRAMUSCULAR | Status: DC | PRN
  Administered 2018-02-02: 6 mL

## 2018-02-02 MED ORDER — DEXAMETHASONE SODIUM PHOSPHATE 4 MG/ML IJ SOLN
INTRAMUSCULAR | Status: DC | PRN
  Administered 2018-02-02: 4 mg via INTRAVENOUS

## 2018-02-02 MED ORDER — MIDAZOLAM HCL 2 MG/2ML IJ SOLN
INTRAMUSCULAR | Status: AC
  Filled 2018-02-02: qty 2

## 2018-02-02 MED ORDER — SCOPOLAMINE 1 MG/3DAYS TD PT72
MEDICATED_PATCH | TRANSDERMAL | Status: AC
  Filled 2018-02-02: qty 1

## 2018-02-02 MED ORDER — METHYLERGONOVINE MALEATE 0.2 MG PO TABS: 0.2000 mg | ORAL_TABLET | Freq: Three times a day (TID) | ORAL | 0 refills | Status: DC

## 2018-02-02 MED ORDER — MIDAZOLAM HCL 5 MG/5ML IJ SOLN
INTRAMUSCULAR | Status: DC | PRN
  Administered 2018-02-02: 2 mg via INTRAVENOUS

## 2018-02-02 MED ORDER — ACETAMINOPHEN 325 MG PO TABS: 325.0000 mg | ORAL_TABLET | ORAL | Status: DC | PRN

## 2018-02-02 MED ORDER — ACETAMINOPHEN 160 MG/5ML PO SOLN: 325.0000 mg | ORAL | Status: DC | PRN

## 2018-02-02 MED ORDER — FENTANYL CITRATE (PF) 100 MCG/2ML IJ SOLN: 25.0000 ug | INTRAMUSCULAR | Status: DC | PRN

## 2018-02-02 MED ORDER — IBUPROFEN 600 MG PO TABS: 600.0000 mg | ORAL_TABLET | Freq: Four times a day (QID) | ORAL | 0 refills | Status: AC | PRN

## 2018-02-02 MED ORDER — PROPOFOL 10 MG/ML IV BOLUS
INTRAVENOUS | Status: DC | PRN
  Administered 2018-02-02: 160 mg via INTRAVENOUS

## 2018-02-02 MED ORDER — LIDOCAINE HCL (CARDIAC) PF 100 MG/5ML IV SOSY
PREFILLED_SYRINGE | INTRAVENOUS | Status: AC
  Filled 2018-02-02: qty 5

## 2018-02-02 SURGICAL SUPPLY — 18 items
CATH ROBINSON RED A/P 16FR (CATHETERS) ×3 IMPLANT
DECANTER SPIKE VIAL GLASS SM (MISCELLANEOUS) ×3 IMPLANT
GLOVE BIO SURGEON STRL SZ 6.5 (GLOVE) ×2 IMPLANT
GLOVE BIO SURGEONS STRL SZ 6.5 (GLOVE) ×1
GLOVE BIOGEL PI IND STRL 7.0 (GLOVE) ×2 IMPLANT
GLOVE BIOGEL PI INDICATOR 7.0 (GLOVE) ×4
GOWN STRL REUS W/TWL LRG LVL3 (GOWN DISPOSABLE) ×6 IMPLANT
KIT BERKELEY 1ST TRIMESTER 3/8 (MISCELLANEOUS) ×3 IMPLANT
NS IRRIG 1000ML POUR BTL (IV SOLUTION) ×3 IMPLANT
PACK VAGINAL MINOR WOMEN LF (CUSTOM PROCEDURE TRAY) ×3 IMPLANT
PAD OB MATERNITY 4.3X12.25 (PERSONAL CARE ITEMS) ×3 IMPLANT
PAD PREP 24X48 CUFFED NSTRL (MISCELLANEOUS) ×3 IMPLANT
SET BERKELEY SUCTION TUBING (SUCTIONS) ×3 IMPLANT
TOWEL OR 17X24 6PK STRL BLUE (TOWEL DISPOSABLE) ×6 IMPLANT
VACURETTE 10 RIGID CVD (CANNULA) IMPLANT
VACURETTE 7MM CVD STRL WRAP (CANNULA) ×3 IMPLANT
VACURETTE 8 RIGID CVD (CANNULA) IMPLANT
VACURETTE 9 RIGID CVD (CANNULA) IMPLANT

## 2018-02-02 NOTE — Transfer of Care (Signed)
Immediate Anesthesia Transfer of Care Note  Patient: Paige Chambers  Procedure(s) Performed: DILATATION AND EVACUATION with genetic studies (N/A )  Patient Location: PACU  Anesthesia Type:General  Level of Consciousness: awake, alert  and oriented  Airway & Oxygen Therapy: Patient Spontanous Breathing and Patient connected to nasal cannula oxygen  Post-op Assessment: Report given to RN and Post -op Vital signs reviewed and stable  Post vital signs: Reviewed and stable  Last Vitals:  Vitals Value Taken Time  BP 108/57 02/02/2018  1:25 PM  Temp    Pulse 71 02/02/2018  1:26 PM  Resp 15 02/02/2018  1:26 PM  SpO2 100 % 02/02/2018  1:26 PM  Vitals shown include unvalidated device data.  Last Pain:  Vitals:   02/02/18 1200  TempSrc: Oral      Patients Stated Pain Goal: 3 (02/89/02 2840)  Complications: No apparent anesthesia complications

## 2018-02-02 NOTE — Discharge Instructions (Signed)

## 2018-02-02 NOTE — Anesthesia Preprocedure Evaluation (Signed)
Anesthesia Evaluation  Patient identified by MRN, date of birth, ID band Patient awake    Reviewed: Allergy & Precautions, H&P , NPO status , Patient's Chart, lab work & pertinent test results  Airway Mallampati: I  TM Distance: >3 FB Neck ROM: full    Dental no notable dental hx. (+) Teeth Intact   Pulmonary former smoker,    Pulmonary exam normal breath sounds clear to auscultation       Cardiovascular negative cardio ROS Normal cardiovascular exam Rhythm:Regular Rate:Normal     Neuro/Psych negative neurological ROS  negative psych ROS   GI/Hepatic negative GI ROS, Neg liver ROS,   Endo/Other    Renal/GU negative Renal ROS     Musculoskeletal negative musculoskeletal ROS (+)   Abdominal Normal abdominal exam  (+)   Peds  Hematology negative hematology ROS (+)   Anesthesia Other Findings   Reproductive/Obstetrics (+) Pregnancy                             Anesthesia Physical  Anesthesia Plan  ASA: II  Anesthesia Plan: General   Post-op Pain Management:    Induction:   PONV Risk Score and Plan: 3 and Ondansetron, Dexamethasone, Midazolam and Propofol infusion  Airway Management Planned: LMA  Additional Equipment:   Intra-op Plan:   Post-operative Plan: Extubation in OR  Informed Consent: I have reviewed the patients History and Physical, chart, labs and discussed the procedure including the risks, benefits and alternatives for the proposed anesthesia with the patient or authorized representative who has indicated his/her understanding and acceptance.   Dental advisory given  Plan Discussed with: CRNA  Anesthesia Plan Comments:         Anesthesia Quick Evaluation

## 2018-02-02 NOTE — Anesthesia Procedure Notes (Signed)
Procedure Name: LMA Insertion Date/Time: 02/02/2018 12:51 PM Performed by: Bufford Spikes, CRNA Pre-anesthesia Checklist: Patient identified, Emergency Drugs available, Suction available and Patient being monitored Patient Re-evaluated:Patient Re-evaluated prior to induction Oxygen Delivery Method: Circle system utilized Preoxygenation: Pre-oxygenation with 100% oxygen Induction Type: IV induction Ventilation: Mask ventilation without difficulty LMA: LMA inserted LMA Size: 3.0 Number of attempts: 1 Placement Confirmation: positive ETCO2 Tube secured with: Tape Dental Injury: Teeth and Oropharynx as per pre-operative assessment

## 2018-02-04 ENCOUNTER — Encounter (HOSPITAL_COMMUNITY): Payer: Self-pay | Admitting: Obstetrics and Gynecology

## 2018-02-16 NOTE — Anesthesia Postprocedure Evaluation (Signed)
Anesthesia Post Note  Patient: Paige Chambers  Procedure(s) Performed: DILATATION AND EVACUATION with genetic studies (N/A )     Patient location during evaluation: PACU Anesthesia Type: General Level of consciousness: awake Pain management: pain level controlled Vital Signs Assessment: post-procedure vital signs reviewed and stable Respiratory status: spontaneous breathing Cardiovascular status: stable Postop Assessment: no apparent nausea or vomiting Anesthetic complications: no    Last Vitals:  Vitals:   02/02/18 1345 02/02/18 1430  BP: 105/70 (!) 102/46  Pulse: 67 68  Resp: 14 16  Temp:    SpO2: 99% 100%    Last Pain:  Vitals:   02/02/18 1430  TempSrc:   PainSc: 0-No pain   Pain Goal: Patients Stated Pain Goal: 3 (02/02/18 1200)               Huston Foley

## 2018-02-17 LAB — MICROARRAY TO WFUBMC

## 2018-02-18 ENCOUNTER — Other Ambulatory Visit: Payer: Self-pay

## 2018-02-18 NOTE — Op Note (Signed)
[name], [name] MEDICAL RECORD [id number] ACCOUNT [id number] DATE OF BIRTH:[birth year] FACILITY: [facility] LOCATION: [facility] PHYSICIAN:Vayla Wilhelmi, MD  OPERATIVE REPORT  DATE OF PROCEDURE:  02/02/2018  PREOPERATIVE DIAGNOSIS:  Missed abortion.  POSTOPERATIVE DIAGNOSIS:  Missed abortion.  PROCEDURE:  Dilation and evacuation.  ANESTHESIA:  General.  SPECIMENS:  Products of conception for microarray analysis and pathology analysis.  COMPLICATIONS:  None.  CONDITION:  Stable to recovery room.  DESCRIPTION OF PROCEDURE:  The patient was taken to the recovery room after informed consent was obtained.  She was given anesthesia, placed in the dorsal lithotomy position using Allen stirrups, prepped and draped sterilely.  An in and out catheter was  used to drain her bladder.  Bivalve speculum was placed in the vagina, and a single-tooth tenaculum was attached to the anterior lip of the cervix.  The cervix was serially dilated, and a suction catheter was inserted.  Products of conception were  evacuated from the uterine cavity.  A gentle curetting was then performed until a uterine cry was noted.  The tenaculum was then removed from the cervix.  The cervix was hemostatic.  Speculum was removed.  She was taken to the recovery room in stable  condition.  Sponge, lap, needle counts, and instrument counts were correct x2.  LN/NUANCE  D:02/18/2018 T:02/18/2018 JOB:[id number]

## 2018-02-22 ENCOUNTER — Ambulatory Visit (HOSPITAL_COMMUNITY)
Admission: RE | Admit: 2018-02-22 | Discharge: 2018-02-22 | Disposition: A | Discharge status: Home / Self Care | Payer: PRIVATE HEALTH INSURANCE | Attending: Obstetrics and Gynecology | Admitting: Obstetrics and Gynecology

## 2018-02-22 ENCOUNTER — Encounter (HOSPITAL_COMMUNITY): Payer: Self-pay | Admitting: *Deleted

## 2018-02-22 ENCOUNTER — Ambulatory Visit (HOSPITAL_COMMUNITY): Payer: PRIVATE HEALTH INSURANCE | Admitting: Anesthesiology

## 2018-02-22 ENCOUNTER — Other Ambulatory Visit: Payer: Self-pay

## 2018-02-22 ENCOUNTER — Ambulatory Visit (HOSPITAL_COMMUNITY): Payer: PRIVATE HEALTH INSURANCE

## 2018-02-22 ENCOUNTER — Encounter (HOSPITAL_COMMUNITY)
Admission: RE | Disposition: A | Discharge status: Home / Self Care | Payer: Self-pay | Source: Home / Self Care | Attending: Obstetrics and Gynecology

## 2018-02-22 DIAGNOSIS — R58 Hemorrhage, not elsewhere classified: Secondary | ICD-10-CM

## 2018-02-22 DIAGNOSIS — Z79899 Other long term (current) drug therapy: Secondary | ICD-10-CM | POA: Insufficient documentation

## 2018-02-22 DIAGNOSIS — Z87891 Personal history of nicotine dependence: Secondary | ICD-10-CM | POA: Diagnosis not present

## 2018-02-22 DIAGNOSIS — O074 Failed attempted termination of pregnancy without complication: Secondary | ICD-10-CM | POA: Insufficient documentation

## 2018-02-22 HISTORY — PX: DILATION AND EVACUATION: SHX1459

## 2018-02-22 HISTORY — PX: OPERATIVE ULTRASOUND: SHX5996

## 2018-02-22 LAB — CBC
HCT: 38.9 % (ref 36.0–46.0)
Hemoglobin: 12.9 g/dL (ref 12.0–15.0)
MCH: 28.6 pg (ref 26.0–34.0)
MCHC: 33.2 g/dL (ref 30.0–36.0)
MCV: 86.3 fL (ref 80.0–100.0)
PLATELETS: 196 10*3/uL (ref 150–400)
RBC: 4.51 MIL/uL (ref 3.87–5.11)
RDW: 12.8 % (ref 11.5–15.5)
WBC: 4.2 10*3/uL (ref 4.0–10.5)
nRBC: 0 % (ref 0.0–0.2)

## 2018-02-22 LAB — HCG, QUANTITATIVE, PREGNANCY: hCG, Beta Chain, Quant, S: 1110 m[IU]/mL — ABNORMAL HIGH (ref <5)

## 2018-02-22 LAB — TYPE AND SCREEN
ABO/RH(D): B POS
ANTIBODY SCREEN: NEGATIVE

## 2018-02-22 SURGERY — DILATION AND EVACUATION, UTERUS
Anesthesia: General

## 2018-02-22 MED ORDER — CHLOROPROCAINE HCL 1 % IJ SOLN
INTRAMUSCULAR | Status: DC | PRN
  Administered 2018-02-22: 10 mL

## 2018-02-22 MED ORDER — METHYLERGONOVINE MALEATE 0.2 MG/ML IJ SOLN
INTRAMUSCULAR | Status: DC | PRN
  Administered 2018-02-22: 0.2 mg via INTRAMUSCULAR

## 2018-02-22 MED ORDER — MEPERIDINE HCL 25 MG/ML IJ SOLN: 6.2500 mg | INTRAMUSCULAR | Status: DC | PRN

## 2018-02-22 MED ORDER — ONDANSETRON HCL 4 MG/2ML IJ SOLN
INTRAMUSCULAR | Status: AC
  Filled 2018-02-22: qty 2

## 2018-02-22 MED ORDER — SODIUM CHLORIDE 0.9 % IV SOLN
INTRAVENOUS | Status: AC
  Filled 2018-02-22: qty 2

## 2018-02-22 MED ORDER — 0.9 % SODIUM CHLORIDE (POUR BTL) OPTIME
TOPICAL | Status: DC | PRN
  Administered 2018-02-22: 1000 mL

## 2018-02-22 MED ORDER — SCOPOLAMINE 1 MG/3DAYS TD PT72
MEDICATED_PATCH | TRANSDERMAL | Status: AC
  Administered 2018-02-22: 1.5 mg via TRANSDERMAL
  Filled 2018-02-22: qty 1

## 2018-02-22 MED ORDER — METHYLERGONOVINE MALEATE 0.2 MG PO TABS: 0.2000 mg | ORAL_TABLET | Freq: Three times a day (TID) | ORAL | 0 refills | Status: AC

## 2018-02-22 MED ORDER — METHYLERGONOVINE MALEATE 0.2 MG/ML IJ SOLN
INTRAMUSCULAR | Status: AC
  Filled 2018-02-22: qty 1

## 2018-02-22 MED ORDER — FENTANYL CITRATE (PF) 100 MCG/2ML IJ SOLN
INTRAMUSCULAR | Status: AC
  Filled 2018-02-22: qty 2

## 2018-02-22 MED ORDER — MIDAZOLAM HCL 5 MG/5ML IJ SOLN
INTRAMUSCULAR | Status: DC | PRN
  Administered 2018-02-22: 2 mg via INTRAVENOUS

## 2018-02-22 MED ORDER — SCOPOLAMINE 1 MG/3DAYS TD PT72
1.0000 | MEDICATED_PATCH | Freq: Once | TRANSDERMAL | Status: DC
  Administered 2018-02-22: 1.5 mg via TRANSDERMAL

## 2018-02-22 MED ORDER — LACTATED RINGERS IV SOLN
INTRAVENOUS | Status: DC
  Administered 2018-02-22 (×2): via INTRAVENOUS

## 2018-02-22 MED ORDER — FENTANYL CITRATE (PF) 100 MCG/2ML IJ SOLN
INTRAMUSCULAR | Status: DC | PRN
  Administered 2018-02-22 (×2): 50 ug via INTRAVENOUS

## 2018-02-22 MED ORDER — OXYCODONE-ACETAMINOPHEN 5-325 MG PO TABS: 1.0000 | ORAL_TABLET | ORAL | 0 refills | Status: AC | PRN

## 2018-02-22 MED ORDER — ONDANSETRON HCL 4 MG/2ML IJ SOLN
INTRAMUSCULAR | Status: DC | PRN
  Administered 2018-02-22: 4 mg via INTRAVENOUS

## 2018-02-22 MED ORDER — HYDROMORPHONE HCL 1 MG/ML IJ SOLN: 0.2500 mg | INTRAMUSCULAR | Status: DC | PRN

## 2018-02-22 MED ORDER — DEXAMETHASONE SODIUM PHOSPHATE 10 MG/ML IJ SOLN
INTRAMUSCULAR | Status: AC
  Filled 2018-02-22: qty 1

## 2018-02-22 MED ORDER — SODIUM CHLORIDE 0.9 % IV SOLN
2.0000 g | INTRAVENOUS | Status: AC
  Administered 2018-02-22: 2 g via INTRAVENOUS

## 2018-02-22 MED ORDER — PROMETHAZINE HCL 25 MG/ML IJ SOLN: 6.2500 mg | INTRAMUSCULAR | Status: DC | PRN

## 2018-02-22 MED ORDER — LIDOCAINE HCL (CARDIAC) PF 100 MG/5ML IV SOSY
PREFILLED_SYRINGE | INTRAVENOUS | Status: DC | PRN
  Administered 2018-02-22: 50 mg via INTRAVENOUS

## 2018-02-22 MED ORDER — PROPOFOL 10 MG/ML IV BOLUS
INTRAVENOUS | Status: DC | PRN
  Administered 2018-02-22: 130 mg via INTRAVENOUS

## 2018-02-22 MED ORDER — MIDAZOLAM HCL 2 MG/2ML IJ SOLN
INTRAMUSCULAR | Status: AC
  Filled 2018-02-22: qty 2

## 2018-02-22 MED ORDER — PROPOFOL 10 MG/ML IV BOLUS
INTRAVENOUS | Status: AC
  Filled 2018-02-22: qty 20

## 2018-02-22 MED ORDER — OXYCODONE HCL 5 MG/5ML PO SOLN: 5.0000 mg | Freq: Once | ORAL | Status: DC | PRN

## 2018-02-22 MED ORDER — OXYCODONE HCL 5 MG PO TABS: 5.0000 mg | ORAL_TABLET | Freq: Once | ORAL | Status: DC | PRN

## 2018-02-22 MED ORDER — CHLOROPROCAINE HCL 1 % IJ SOLN
INTRAMUSCULAR | Status: AC
  Filled 2018-02-22: qty 30

## 2018-02-22 MED ORDER — DEXAMETHASONE SODIUM PHOSPHATE 10 MG/ML IJ SOLN
INTRAMUSCULAR | Status: DC | PRN
  Administered 2018-02-22: 10 mg via INTRAVENOUS

## 2018-02-22 MED ORDER — LIDOCAINE HCL (CARDIAC) PF 100 MG/5ML IV SOSY
PREFILLED_SYRINGE | INTRAVENOUS | Status: AC
  Filled 2018-02-22: qty 5

## 2018-02-22 SURGICAL SUPPLY — 19 items
CATH ROBINSON RED A/P 16FR (CATHETERS) ×3 IMPLANT
DECANTER SPIKE VIAL GLASS SM (MISCELLANEOUS) ×3 IMPLANT
GLOVE BIO SURGEON STRL SZ 6.5 (GLOVE) ×2 IMPLANT
GLOVE BIO SURGEONS STRL SZ 6.5 (GLOVE) ×1
GLOVE BIOGEL PI IND STRL 7.0 (GLOVE) ×2 IMPLANT
GLOVE BIOGEL PI INDICATOR 7.0 (GLOVE) ×4
GOWN STRL REUS W/TWL LRG LVL3 (GOWN DISPOSABLE) ×6 IMPLANT
HIBICLENS CHG 4% 4OZ BTL (MISCELLANEOUS) ×3 IMPLANT
KIT BERKELEY 1ST TRIMESTER 3/8 (MISCELLANEOUS) ×3 IMPLANT
NS IRRIG 1000ML POUR BTL (IV SOLUTION) ×3 IMPLANT
PACK VAGINAL MINOR WOMEN LF (CUSTOM PROCEDURE TRAY) ×3 IMPLANT
PAD OB MATERNITY 4.3X12.25 (PERSONAL CARE ITEMS) ×3 IMPLANT
PAD PREP 24X48 CUFFED NSTRL (MISCELLANEOUS) ×3 IMPLANT
SET BERKELEY SUCTION TUBING (SUCTIONS) ×3 IMPLANT
TOWEL OR 17X24 6PK STRL BLUE (TOWEL DISPOSABLE) ×6 IMPLANT
VACURETTE 10 RIGID CVD (CANNULA) IMPLANT
VACURETTE 7MM CVD STRL WRAP (CANNULA) ×3 IMPLANT
VACURETTE 8 RIGID CVD (CANNULA) IMPLANT
VACURETTE 9 RIGID CVD (CANNULA) IMPLANT

## 2018-02-22 NOTE — H&P (Signed)
Paige Chambers is an 37 y.o. female  G6P3 presents for [facility] for retained POC.  Pt with h/o recurrent pregnancy loss.  Denies heavy VB and pain.  enstrual History: Patient's last menstrual period was 11/30/2017 (exact date).    Past Medical History:  Diagnosis Date  . Gestational diabetes    with all pregnancy- no current meds  . Hx of varicella   . Liver hemangioma   . Pre-diabetes    with 2019 pregnancy  . Preterm premature rupture of membranes (PPROM) with unknown onset of labor 03/27/2015  . SVD (spontaneous vaginal delivery)    x 2    Past Surgical History:  Procedure Laterality Date  . CESAREAN SECTION N/A 04/13/2015   Procedure: CESAREAN SECTION;  Surgeon: Marylynn Pearson, MD;  Location: [facility];  Service: Obstetrics;  Laterality: N/A;  . D & C Hysteroscopy  06/2017  . DILATION AND CURETTAGE OF UTERUS N/A 10/03/2016   Procedure: DILATATION AND CURETTAGE;  Surgeon: Aletha Halim, MD;  Location: [facility];  Service: Gynecology;  Laterality: N/A;  . DILATION AND EVACUATION N/A 02/02/2018   Procedure: DILATATION AND EVACUATION with genetic studies;  Surgeon: Marylynn Pearson, MD;  Location: [facility];  Service: Gynecology;  Laterality: N/A;  genetic studies; this time OK per Manuela Schwartz  . WISDOM TOOTH EXTRACTION      Family History  Problem Relation Age of Onset  . Hypertension Father   . Heart disease Father   . Hypertension Mother     Social History:  reports that she quit smoking about 6 years ago. Her smoking use included cigarettes. She has a 5.00 pack-year smoking history. She has never used smokeless tobacco. She reports current alcohol use. She reports that she does not use drugs.  Allergies:  Allergies  Allergen Reactions  . Iohexol Hives and Nausea And Vomiting         Medications Prior to Admission  Medication Sig Dispense Refill Last Dose  . Barberry-Oreg Grape-Goldenseal (BERBERINE COMPLEX PO) Take 1 capsule by mouth daily. Berberine   Past Week at Unknown time  .  CINNAMON PO Take 1 capsule by mouth daily.    Past Week at Unknown time  . Prenat-FeCbn-FeAspGl-FA-Omega (OB COMPLETE PETITE) 35-5-1-200 MG CAPS Take 1 capsule by mouth daily.  12 Past Week at Unknown time  . ibuprofen (ADVIL,MOTRIN) 600 MG tablet Take 1 tablet (600 mg total) by mouth every 6 (six) hours as needed. (Patient not taking: Reported on 02/18/2018) 30 tablet 0 Completed Course at Unknown time  . methylergonovine (METHERGINE) 0.2 MG tablet Take 1 tablet (0.2 mg total) by mouth 3 (three) times daily. (Patient not taking: Reported on 02/18/2018) 9 tablet 0 Completed Course at Unknown time    ROS  Blood pressure (!) 112/58, pulse 91, temperature 98.3 F (36.8 C), temperature source Oral, resp. rate 16, height 5\' 4"  (1.626 m), weight 60.3 kg, last menstrual period 11/30/2017, SpO2 100 %. Physical Exam  Results for orders placed or performed during the hospital encounter of 02/22/18 (from the past 24 hour(s))  Type and screen [place]     Status: None   Collection Time: 02/22/18  6:15 AM  Result Value Ref Range   ABO/RH(D) B POS    Antibody Screen NEG    Sample Expiration      02/25/2018 Performed at [facility], [address] [place], [place] [address]   CBC     Status: None   Collection Time: 02/22/18  6:15 AM  Result Value  Ref Range   WBC 4.2 4.0 - 10.5 K/uL   RBC 4.51 3.87 - 5.11 MIL/uL   Hemoglobin 12.9 12.0 - 15.0 g/dL   HCT 38.9 36.0 - 46.0 %   MCV 86.3 80.0 - 100.0 fL   MCH 28.6 26.0 - 34.0 pg   MCHC 33.2 30.0 - 36.0 g/dL   RDW 12.8 11.5 - 15.5 %   Platelets 196 150 - 400 K/uL   nRBC 0.0 0.0 - 0.2 %  hCG, quantitative, pregnancy     Status: Abnormal   Collection Time: 02/22/18  6:15 AM  Result Value Ref Range   hCG, Beta Chain, Quant, S 1,110 (H) <5 mIU/mL   [place]:  Heterogeneous tissue in uterus + blood flow on doppler  Assessment/Plan: Retained POC D&C with ultrasound guidance R/b/a discussed, questions answered, informed  consent  Marylynn Pearson 02/22/2018, 7:38 AM

## 2018-02-22 NOTE — Discharge Instructions (Signed)

## 2018-02-22 NOTE — Transfer of Care (Signed)
Immediate Anesthesia Transfer of Care Note  Patient: Paige Chambers  Procedure(s) Performed: DILATATION AND EVACUATION (N/A ) OPERATIVE ULTRASOUND (N/A )  Patient Location: PACU  Anesthesia Type:General  Level of Consciousness: sedated  Airway & Oxygen Therapy: Patient Spontanous Breathing and Patient connected to nasal cannula oxygen  Post-op Assessment: Report given to RN and Post -op Vital signs reviewed and stable  Post vital signs: stable  Last Vitals:  Vitals Value Taken Time  BP 108/63 02/22/2018  8:16 AM  Temp    Pulse 71 02/22/2018  8:17 AM  Resp 12 02/22/2018  8:17 AM  SpO2 100 % 02/22/2018  8:17 AM  Vitals shown include unvalidated device data.  Last Pain:  Vitals:   02/22/18 0626  TempSrc: Oral  PainSc: 0-No pain      Patients Stated Pain Goal: 3 (26/20/35 5974)  Complications: No apparent anesthesia complications

## 2018-02-22 NOTE — Op Note (Signed)
NAMEASSYRIA, MORREALE MEDICAL RECORD [id number] ACCOUNT [id number] DATE OF BIRTH:[birth year] FACILITY: [facility] LOCATION: [facility] PHYSICIAN:Judieth Mckown, MD  OPERATIVE REPORT  DATE OF PROCEDURE:  02/22/2018  PREOPERATIVE DIAGNOSIS:  Retained products of conception.  POSTOPERATIVE DIAGNOSIS:  Retained products of conception.  PROCEDURE PERFORMED:  Ultrasound-guided dilation and evacuation.  SURGEON:  Marylynn Pearson, MD  ANESTHESIA:  General with local.  COMPLICATIONS:  None.  CONDITION:  Stable to recovery room.  DESCRIPTION OF PROCEDURE:  The patient was taken to the operating room.  After informed consent was obtained, she was prepped and draped in sterile fashion, and a Foley catheter was used to drain her bladder.  A bivalve speculum was placed in the vagina,  and a single-tooth tenaculum was placed on the anterior lip.  Paracervical block was performed.  The cervix was dilated using Pratt dilators, and a 7-French suction catheter was inserted under ultrasound guidance.  A small pocket of fluid was noted in  the endometrial cavity, and this was evacuated with the suction curette.  A gentle curetting was then performed.  After the procedure, an ultrasound was done with no retained products of conception.  The tenaculum was removed from the cervix.  The cervix  was hemostatic.  The speculum was removed.  She was taken to the recovery room in stable condition.  Sponge, lap, needle, and instrument counts were correct x2.  LN/NUANCE  D:02/22/2018 T:02/22/2018 JOB:[id number]

## 2018-02-22 NOTE — Anesthesia Postprocedure Evaluation (Signed)
Anesthesia Post Note  Patient: Paige Chambers  Procedure(s) Performed: DILATATION AND EVACUATION (N/A ) OPERATIVE ULTRASOUND (N/A )     Patient location during evaluation: PACU Anesthesia Type: General Level of consciousness: awake and alert Pain management: pain level controlled Vital Signs Assessment: post-procedure vital signs reviewed and stable Respiratory status: spontaneous breathing, nonlabored ventilation and respiratory function stable Cardiovascular status: blood pressure returned to baseline and stable Postop Assessment: no apparent nausea or vomiting Anesthetic complications: no    Last Vitals:  Vitals:   02/22/18 0903 02/22/18 0906  BP:    Pulse: 70 70  Resp: (!) 27 17  Temp:  36.7 C  SpO2: 97% 99%    Last Pain:  Vitals:   02/22/18 0906  TempSrc: Oral  PainSc: 0-No pain   Pain Goal: Patients Stated Pain Goal: 3 (02/22/18 0626)               Lynda Rainwater

## 2018-02-22 NOTE — Anesthesia Procedure Notes (Signed)
Procedure Name: LMA Insertion Date/Time: 02/22/2018 7:46 AM Performed by: Ignacia Bayley, CRNA Pre-anesthesia Checklist: Patient identified, Patient being monitored, Emergency Drugs available, Timeout performed and Suction available Patient Re-evaluated:Patient Re-evaluated prior to induction Oxygen Delivery Method: Circle System Utilized Preoxygenation: Pre-oxygenation with 100% oxygen Induction Type: IV induction Ventilation: Mask ventilation without difficulty LMA: LMA inserted LMA Size: 4.0 Number of attempts: 1 Placement Confirmation: positive ETCO2 and breath sounds checked- equal and bilateral

## 2018-02-22 NOTE — Anesthesia Preprocedure Evaluation (Signed)
Anesthesia Evaluation  Patient identified by MRN, date of birth, ID band Patient awake    Reviewed: Allergy & Precautions, H&P , NPO status , Patient's Chart, lab work & pertinent test results  Airway Mallampati: I  TM Distance: >3 FB Neck ROM: full    Dental no notable dental hx. (+) Teeth Intact   Pulmonary former smoker,    Pulmonary exam normal breath sounds clear to auscultation       Cardiovascular negative cardio ROS Normal cardiovascular exam Rhythm:Regular Rate:Normal     Neuro/Psych negative neurological ROS  negative psych ROS   GI/Hepatic negative GI ROS, Neg liver ROS,   Endo/Other    Renal/GU negative Renal ROS     Musculoskeletal negative musculoskeletal ROS (+)   Abdominal Normal abdominal exam  (+)   Peds  Hematology negative hematology ROS (+)   Anesthesia Other Findings   Reproductive/Obstetrics                             Anesthesia Physical  Anesthesia Plan  ASA: II  Anesthesia Plan: General   Post-op Pain Management:    Induction: Intravenous  PONV Risk Score and Plan: 3 and Ondansetron, Dexamethasone and Midazolam  Airway Management Planned: LMA  Additional Equipment:   Intra-op Plan:   Post-operative Plan: Extubation in OR  Informed Consent: I have reviewed the patients History and Physical, chart, labs and discussed the procedure including the risks, benefits and alternatives for the proposed anesthesia with the patient or authorized representative who has indicated his/her understanding and acceptance.   Dental advisory given  Plan Discussed with: CRNA  Anesthesia Plan Comments:         Anesthesia Quick Evaluation

## 2018-02-23 ENCOUNTER — Encounter (HOSPITAL_COMMUNITY): Payer: Self-pay | Admitting: Obstetrics and Gynecology

## 2018-03-23 LAB — CHROMOSOME STD, POC(TISSUE)-NCBH

## 2018-07-19 IMAGING — US US OB COMP LESS 14 WK
1 series · 15 of 28 positions shown · non-contrast
Comparison: None.

CLINICAL DATA: Initial evaluation for recent miscarriage, status
post sciatic sac, still with vaginal bleeding. Concern for retained
products of conception.

EXAM:
OBSTETRIC <14 WK US AND TRANSVAGINAL OB US
TECHNIQUE: Both transabdominal and transvaginal ultrasound examinations were
performed for complete evaluation of the gestation as well as the
maternal uterus, adnexal regions, and pelvic cul-de-sac.
Transvaginal technique was performed to assess early pregnancy.

[Series 1: us ob comp less 14 wk · 15 of 84 slices shown]
[im 1/84]
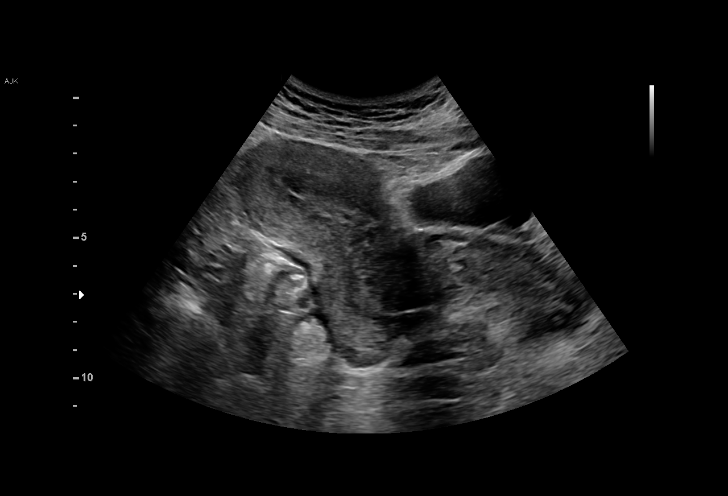
[im 7/84]
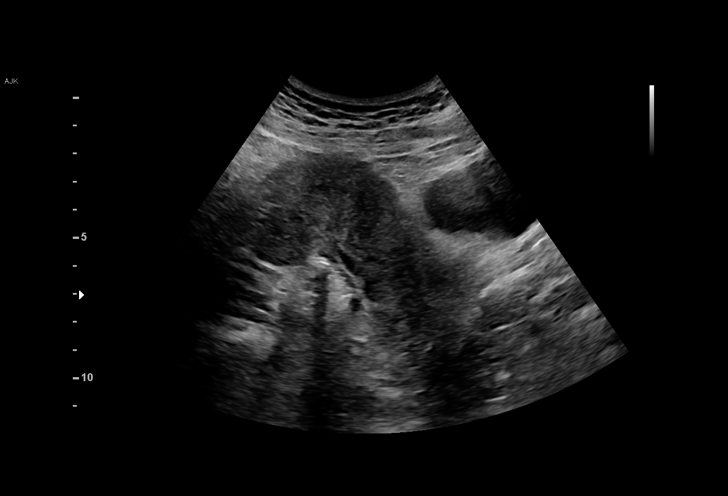
[im 13/84]
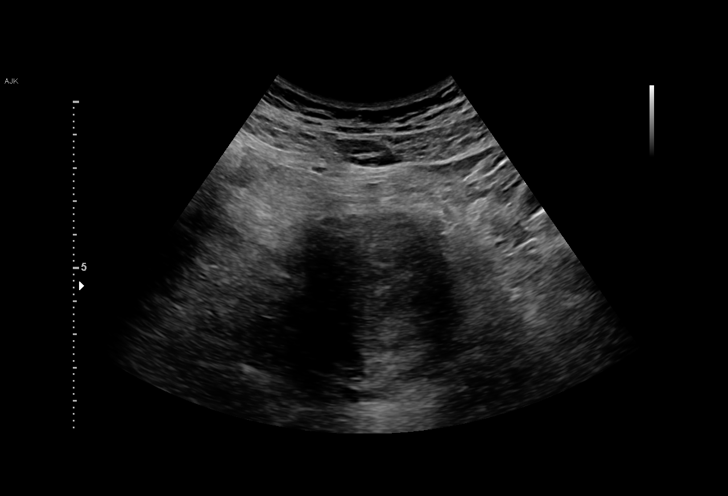
[im 19/84]
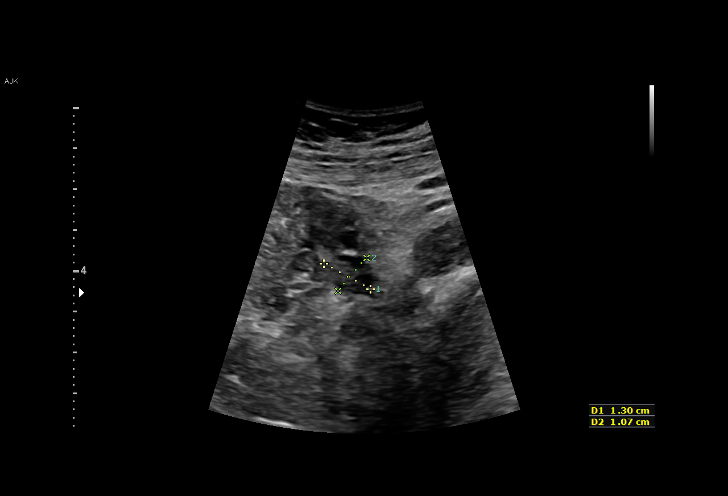
[im 25/84]
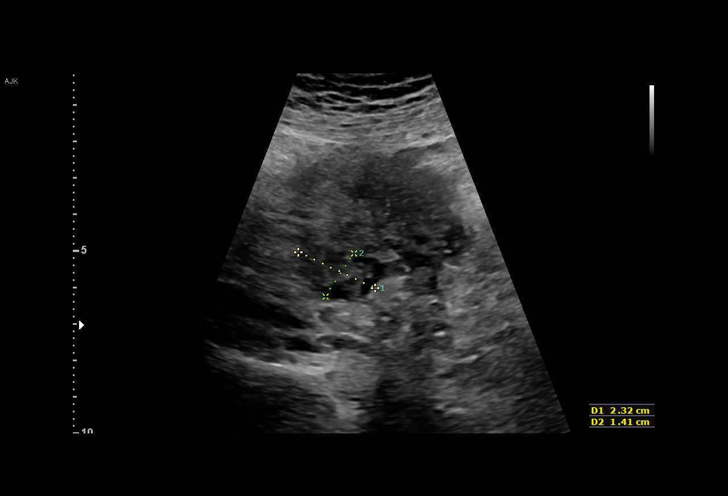
[im 31/84]
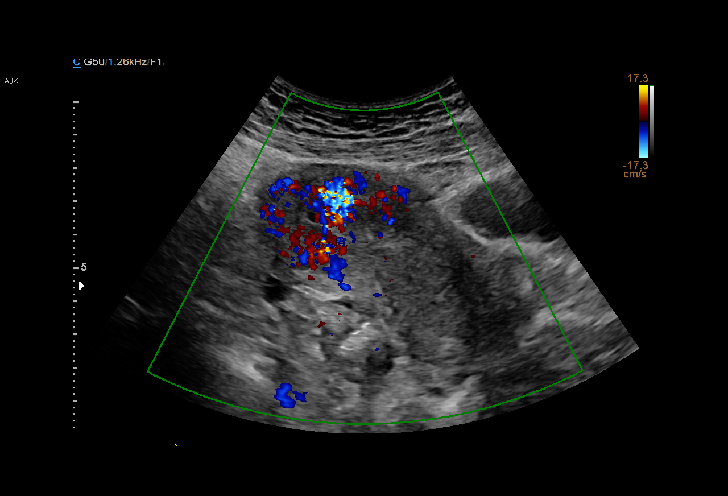
[im 37/84]
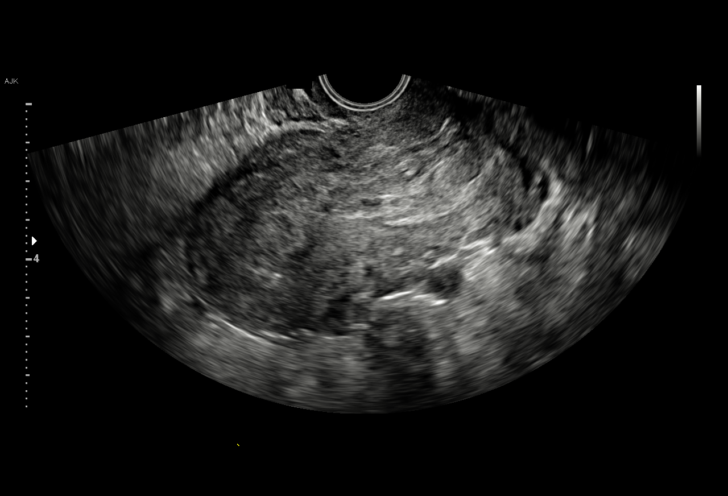
[im 44/84]
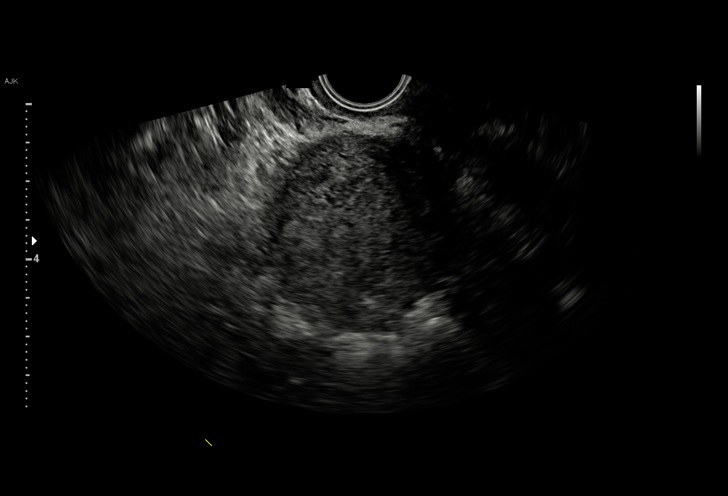
[im 47/84]
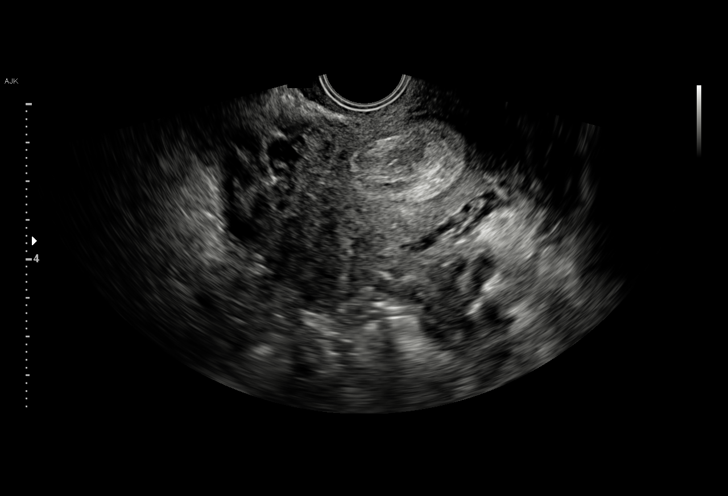
[im 53/84]
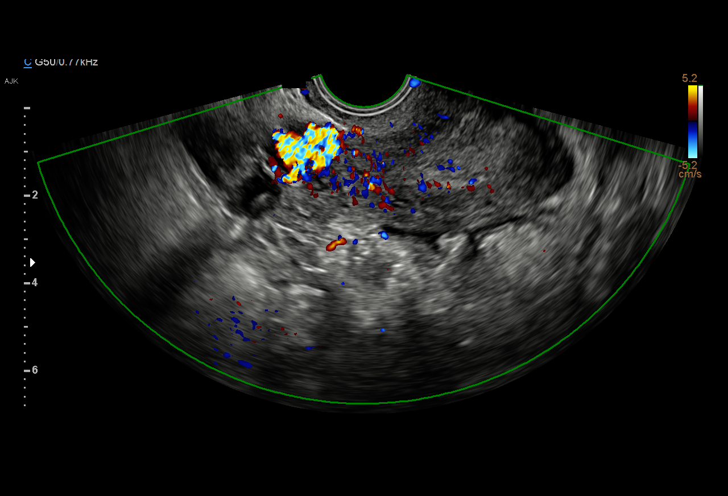
[im 59/84]
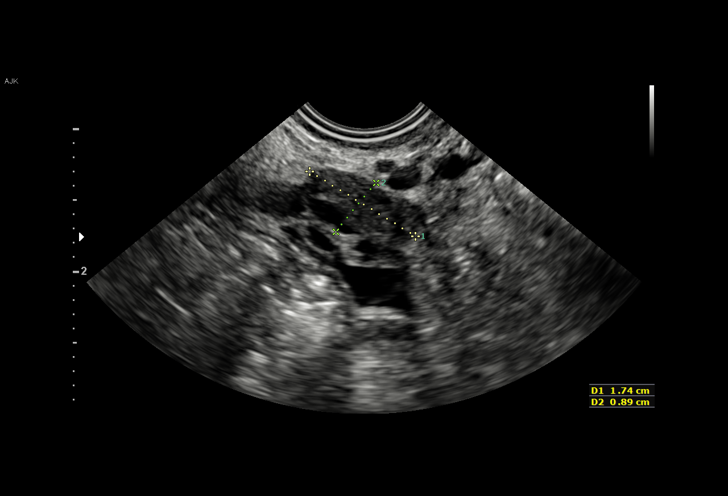
[im 65/84]
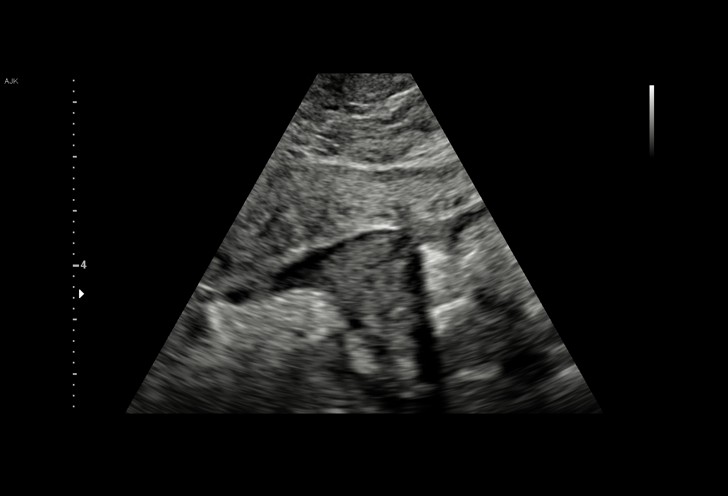
[im 71/84]
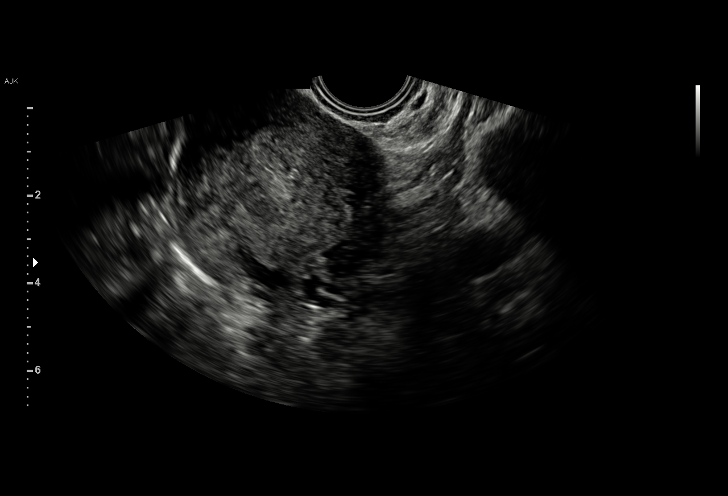
[im 77/84]
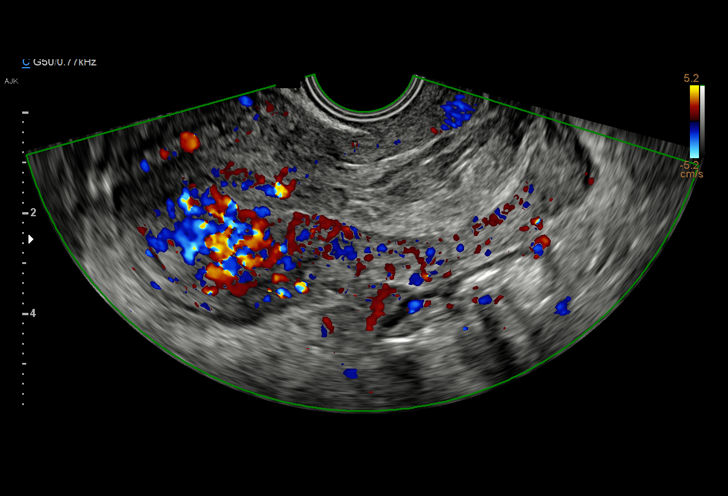
[im 84/84]
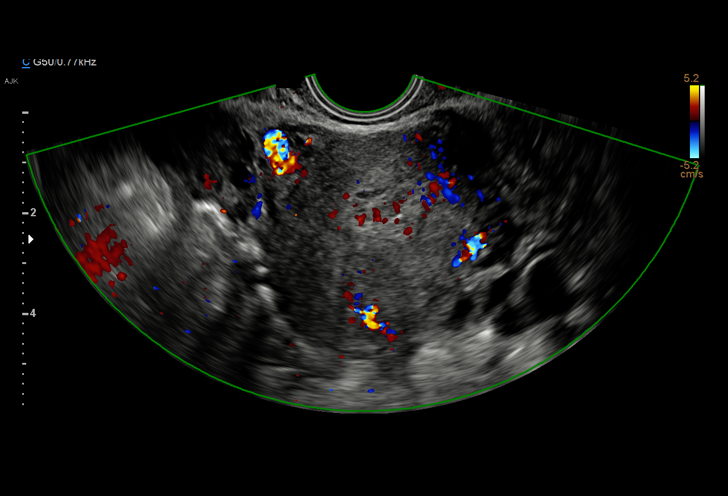

[15 of 28 positions shown; findings below may reference images not displayed]

FINDINGS: Intrauterine gestational sac: None.

Yolk sac:  N/A.

Embryo:  N/A.

Cardiac Activity: N/A.

Heart Rate: N/A.  Bpm

Subchorionic hemorrhage:  None visualized.

Maternal uterus/adnexae: Both the right and left ovaries well
visualized within the adnexa and are normal in appearance. No
adnexal mass.

Maternal endometrium thickened up to approximately 17 mm with
heterogeneous echotexture. Few scattered small cystic areas noted.
Scattered areas of vascularity present. Findings concerning for
retained products of conception.
IMPRESSION: Thickened heterogeneous endometrium as above with associated
scattered areas of vascularity. Findings concerning for retained
products of conception.
